# Patient Record
Sex: Male | Born: 1980 | Race: Black or African American | Hispanic: No | Marital: Married | Smoking: Never smoker
Health system: Southern US, Community
[De-identification: ages and names within clinical notes are randomized; demographics above are authoritative.]

## PROBLEM LIST (undated history)

## (undated) DIAGNOSIS — H8109 Meniere's disease, unspecified ear: Secondary | ICD-10-CM

## (undated) HISTORY — PX: HAND SURGERY: SHX662

## (undated) HISTORY — PX: I & D EXTREMITY: SHX5045

## (undated) HISTORY — PX: ABDOMINAL AORTOGRAM W/LOWER EXTREMITY: CATH118223

## (undated) HISTORY — PX: EXTERNAL FIXATION LEG: SHX1549

## (undated) HISTORY — PX: INCISION AND DRAINAGE OF WOUND: SHX1803

## (undated) HISTORY — PX: PERCUTANEOUS PINNING: SHX2209

## (undated) HISTORY — PX: APPLICATION OF WOUND VAC: SHX5189

## (undated) HISTORY — PX: CLOSED REDUCTION WRIST FRACTURE: SHX1091

## (undated) HISTORY — PX: CLOSED REDUCTION TIBIA: SHX5115

## (undated) SURGICAL SUPPLY — 6 items
CUFF TOURN SGL QUICK 24 (TOURNIQUET CUFF)
GLOVE BIOGEL PI INDICATOR 7.5 (GLOVE) ×3
GLOVE BIOGEL PI INDICATOR 8 (GLOVE) ×9
GLOVE SURG SYN 7.5  E (GLOVE) ×3
GOWN STRL REUS W/TWL LRG LVL3 (GOWN DISPOSABLE) ×12
GOWN STRL REUS W/TWL XL LVL3 (GOWN DISPOSABLE) ×6

## (undated) SURGICAL SUPPLY — 5 items
CONT SPEC 4OZ STRL OR WHT (MISCELLANEOUS)
GLOVE SURG SYN 7.5  E (GLOVE) ×1
GOWN STRL REUS W/TWL LRG LVL3 (GOWN DISPOSABLE) ×2
GOWN STRL REUS W/TWL XL LVL3 (GOWN DISPOSABLE) ×2
HANDPIECE INTERPULSE COAX TIP (DISPOSABLE)

## (undated) SURGICAL SUPPLY — 8 items
BNDG GAUZE DERMACEA FLUFF (GAUZE/BANDAGES/DRESSINGS) ×2
GLOVE BIOGEL PI INDICATOR 7.5 (GLOVE) ×2
GLOVE BIOGEL PI INDICATOR 8 (GLOVE) ×4
GLOVE SURG SYN 7.5  E (GLOVE) ×2
GOWN STRL REUS W/TWL LRG LVL3 (GOWN DISPOSABLE) ×4
GOWN STRL REUS W/TWL XL LVL3 (GOWN DISPOSABLE) ×4
PIN APEX 5X180 (PIN) ×4
SUT VIC AB 0 CT1 27 (SUTURE) ×8

## (undated) SURGICAL SUPPLY — 7 items
CONT SPEC 4OZ STRL OR WHT (MISCELLANEOUS)
CUFF TOURN SGL QUICK 34 (TOURNIQUET CUFF)
DRAPE ORTHO SPLIT 77X108 STRL (DRAPES) ×4
GOWN STRL REUS W/TWL LRG LVL3 (GOWN DISPOSABLE) ×4
GOWN STRL REUS W/TWL XL LVL3 (GOWN DISPOSABLE) ×2
HANDPIECE INTERPULSE COAX TIP (DISPOSABLE) ×2
PADDING CAST COTTON 4X4 STRL (CAST SUPPLIES)

## (undated) SURGICAL SUPPLY — 7 items
CONT SPEC 4OZ STRL OR WHT (MISCELLANEOUS)
CUFF TOURN SGL QUICK 34 (TOURNIQUET CUFF)
GLOVE SURG SYN 7.5  E (GLOVE) ×1
GOWN STRL REUS W/TWL LRG LVL3 (GOWN DISPOSABLE) ×2
GOWN STRL REUS W/TWL XL LVL3 (GOWN DISPOSABLE) ×2
HANDPIECE INTERPULSE COAX TIP (DISPOSABLE)
PADDING CAST COTTON 4X4 STRL (CAST SUPPLIES) ×1

## (undated) SURGICAL SUPPLY — 3 items
DRAPE ORTHO SPLIT 77X108 STRL (DRAPES) ×1
GOWN STRL REUS W/TWL LRG LVL3 (GOWN DISPOSABLE) ×1
HANDPIECE INTERPULSE COAX TIP (DISPOSABLE)

---
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
## 2021-10-06 DIAGNOSIS — T8149XA Infection following a procedure, other surgical site, initial encounter: Secondary | ICD-10-CM

## 2021-10-06 DIAGNOSIS — S83105A Unspecified dislocation of left knee, initial encounter: Secondary | ICD-10-CM

## 2021-10-06 HISTORY — DX: Meniere's disease, unspecified ear: H81.09

## 2021-10-06 HISTORY — PX: EXTERNAL FIXATION LEG: SHX1549

## 2021-10-06 HISTORY — PX: CLOSED REDUCTION WRIST FRACTURE: SHX1091
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
## 2021-10-06 NOTE — Anesthesia Postprocedure Evaluation (Signed)
Anesthesia Post Note  Patient: Bradley [name]  Procedure(s) Performed: OPEN REDUCTION EXTERNAL FIXATION LEFT KNEE (Left: Knee) CLOSED REDUCTION LEFT WRIST (Wrist) CLOSED REDUCTION WRIST (Wrist)     Patient location during evaluation: PACU Anesthesia Type: General Level of consciousness: awake and alert Pain management: pain level controlled Vital Signs Assessment: post-procedure vital signs reviewed and stable Respiratory status: spontaneous breathing, nonlabored ventilation, respiratory function stable and patient connected to nasal cannula oxygen Cardiovascular status: blood pressure returned to baseline and stable Postop Assessment: no apparent nausea or vomiting Anesthetic complications: no   No notable events documented.  Last Vitals:  Vitals:   10/06/21 1920 10/06/21 1935  BP: (!) 166/74 (!) 148/77  Pulse: 91 98  Resp: (!) 23 (!) 22  Temp:  36.6 C  SpO2: 100% 99%    Last Pain:  Vitals:   10/06/21 1935  TempSrc:   PainSc: Asleep                 Beryle Lathe
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
## 2021-10-06 NOTE — Transfer of Care (Signed)
Immediate Anesthesia Transfer of Care Note  Patient: Bradley [name]  Procedure(s) Performed: OPEN REDUCTION EXTERNAL FIXATION LEFT KNEE (Left: Knee) CLOSED REDUCTION LEFT WRIST (Wrist) CLOSED REDUCTION WRIST (Wrist)  Patient Location: PACU  Anesthesia Type:General  Level of Consciousness: drowsy and patient cooperative  Airway & Oxygen Therapy: Patient Spontanous Breathing and Patient connected to nasal cannula oxygen  Post-op Assessment: Report given to RN and Post -op Vital signs reviewed and stable  Post vital signs: Reviewed and stable  Last Vitals:  Vitals Value Taken Time  BP 117/99 10/06/21 1849  Temp    Pulse 94 10/06/21 1856  Resp 42 10/06/21 1858  SpO2 87 % 10/06/21 1856  Vitals shown include unvalidated device data.  Last Pain:  Vitals:   10/06/21 1703  TempSrc:   PainSc: 10-Worst pain ever         Complications: No notable events documented.

## 2021-10-06 NOTE — Discharge Instructions (Addendum)
REGARDING YOUR LEFT UPPER EXTREMITY:  KEEP THE SPLINT ON, CLEAN AND DRY WORK ON YOUR FINGER MOTION, MAKING FULL FIST FOLLOWED BY FULLY STRAIGHTENING THE FINGERS NO ACTIVITIES WITH THE LEFT HAND MORE THAN PAPER/PENCIL TYPE ACTIVITIES ELEVATE AS NEEDED TO REDUCE SWELLING    POST-OPERATIVE OPIOID TAPER INSTRUCTIONS: It is important to wean off of your opioid medication as soon as possible. If you do not need pain medication after your surgery it is ok to stop day one. Opioids include: Codeine, Hydrocodone(Norco, Vicodin), Oxycodone(Percocet, oxycontin) and hydromorphone amongst others.  Long term and even short term use of opiods can cause: Increased pain response Dependence Constipation Depression Respiratory depression And more.  Withdrawal symptoms can include Flu like symptoms Nausea, vomiting And more Techniques to manage these symptoms Hydrate well Eat regular healthy meals Stay active Use relaxation techniques(deep breathing, meditating, yoga) Do Not substitute Alcohol to help with tapering If you have been on opioids for less than two weeks and do not have pain than it is ok to stop all together.  Plan to wean off of opioids This plan should start within one week post op of your joint replacement. Maintain the same interval or time between taking each dose and first decrease the dose.  Cut the total daily intake of opioids by one tablet each day Next start to increase the time between doses. The last dose that should be eliminated is the evening dose.     Wound Care   Guide to Wound Care  Proper wound care may reduce the risk of infection, improve healing rates, and limit scarring.  This is a general guide to help care for and manage wounds treated with product wound matrix.   Dressing Changes The frequency of dressing changes can vary based on which product was applied, the size of the wound, or the amount of wound drainage. Dressing inspections are recommended, at  least weekly.   If you have a Wound VAC it will be changed in one week after the first time it is applied.  Then it will be changed once or twice a week.   If you don't have a Wound VAC, then place KY gel on the wound daily and cover with gauze.  Dressing Types Primary Dressing:  Non-adherent dressing goes directly over wounds being treated with the powder or sheet.  Secondary Dressing:  Secures the primary dressing in place and provides extra protection, compression, and absorption.  1. Wash Hands - To help decrease the risk of infection, caregivers should wash their hands for a minimum of 20 seconds and may use medical gloves.   2. Remove the Dressings - Avoid removing product from the wound by carefully removing the applicable dressing(s) at the time points recommended above, or as recommended by the treating physician.  Expected Color and Odor:  It is entirely normal for the wound to have an unpleasant odor and to form a caramel-colored gel as the product absorbs into the wound. It is important to leave this gel on the wound site.  3. Clean the Wound - Use clean water or saline to gently rinse around the wound surface and remove any excess discharge that may be present on the wound. Do not wipe off any of the caramel-colored gel on the wound.   What to look out for:  Large or increased amount of drainage   Surrounding skin has worsening redness or hot to touch   Increased pain in or around the wound   Flu-like symptoms, fatigue,  decreased appetite, fever   Hard, crusty wound surface with black or brown coloring  4. Apply New Dressings - Dressings should cover the entire wound and be suitable for maintaining a moist wound environment.  The non-adherent mesh dressing should be left in place.  New dressing should consist of KY Jelly to keep the wound moist and soft gauze secured with a wrap or tape.   Maintain a Hydrated Wound Area It is important to keep the wound area moist throughout the  healing process. If the wound appears to be dry during dressing changes, select a dressing that will hydrate the wound and maintain that ideal moist environment. If you are unsure what to do, ask the treating physician.  Remodeling Process Every patient heals differently, and no two cases are the same. The size and location of the wound, product type and layering configurations, and general patient health all contribute to how quickly a wound will heal.  While many factors can influence the rate at which the product absorbs, the following can be used as a general guide.   THINGS TO DO: Refrain from smoking High protein diet with plenty of vegetables and some fruit  Limit simple processed carbohydrates and sugar Protect the wound from trauma Protect the dressing  powder       Sheet            Sorbact dressing

## 2021-10-06 NOTE — Progress Notes (Signed)
Pt's Vital signs are stable at this time. RT place [place] on Pt. RT available if needed.
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
## 2021-10-06 NOTE — ED Notes (Signed)
Tourniquets both removed by Dr. Criss Alvine.  Bleeding controlled.
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
## 2021-10-06 NOTE — ED Notes (Signed)
Dr. Bedelia Person at bedside to speak with Dr. Criss Alvine.

## 2021-10-06 NOTE — Anesthesia Procedure Notes (Signed)
Procedure Name: Intubation Date/Time: 10/06/2021 4:49 PM  Performed by: Moshe Salisbury, CRNAPre-anesthesia Checklist: Patient identified, Emergency Drugs available, Suction available and Patient being monitored Patient Re-evaluated:Patient Re-evaluated prior to induction Oxygen Delivery Method: Circle System Utilized Preoxygenation: Pre-oxygenation with 100% oxygen Induction Type: IV induction and Rapid sequence Laryngoscope Size: Mac and 4 Grade View: Grade I Tube type: Oral Tube size: 8.0 mm Number of attempts: 1 Airway Equipment and Method: Stylet Placement Confirmation: ETT inserted through vocal cords under direct vision, positive ETCO2 and breath sounds checked- equal and bilateral Secured at: 24 cm Tube secured with: Tape Dental Injury: Teeth and Oropharynx as per pre-operative assessment

## 2021-10-06 NOTE — ED Notes (Signed)
Earney Hamburg notified

## 2021-10-06 NOTE — ED Notes (Signed)
Earney Hamburg PA arrived
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
## 2021-10-06 NOTE — Progress Notes (Signed)
Orthopedic Tech Progress Note Patient Details:  Bradley [name] [birth year] [id number]  Patient ID: Bradley [name], male   DOB: 17-Oct-1980, 41 y.o.   MRN: [id number] Level I; not needed. Darleen Crocker 10/06/2021, 4:57 PM

## 2021-10-06 NOTE — Op Note (Signed)
10/06/2021  5:45 PM  PATIENT:  Bradley [name]  41 y.o. male  PRE-OPERATIVE DIAGNOSIS: Left wrist lunate dislocation with palmar wound  POST-OPERATIVE DIAGNOSIS:  Same  PROCEDURE:   1.  Irrigation of left palmar wound with simple closure, 3 cm    2.  Closed reduction of left wrist lunate dislocation with application of sugar-tong splint  SURGEON: Cliffton Asters. Janee Morn, MD  PHYSICIAN ASSISTANT: None  ANESTHESIA: General  SPECIMENS:  None  DRAINS:   None  EBL: None  PREOPERATIVE INDICATIONS:  [name] is a  41 y.o. male with a closed left wrist lunate dislocation with overlying facial palmar wound.  At the time that I was consulted, the patient was imminently entering the operating room for other injuries, so I first evaluated the patient in the operating room.  Another orthopedic surgeon, Dr. Margarita Rana, was taking the patient to the operating room to address his left knee injury, and had spoken with him regarding the need to at least temporize the left wrist and the plan that I would join him in the operating room to do so.  The patient consented.  OPERATIVE IMPLANTS: None  OPERATIVE PROCEDURE: The patient was already under general anesthesia and treatment for his left knee was underway.  I evaluated the palmar wound, probed for depth to confirm its superficial nature, & prescrubbed it with a Hibiclens scrub brush.  A single staple was then placed to reapproximate it loosely.  Next, I performed a closed reduction, with mini C arm guidance.  Once gross reduction and alignment was obtained, I applied a sugar-tong splint and obtained final images.  DISPOSITION: Patient remained in the operating room under the care of Dr. Eulah Pont for his left knee.  I will need to have additional discussions with the patient regarding options for definitive management of his left wrist.  In the interim, he should be nonweightbearing on the left wrist, but may bear weight through a platform  device
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
## 2021-10-07 NOTE — H&P (View-Only) (Signed)
Subjective: Patient reports pain as marked. Worse with movement. Tolerating diet.  Urinating.  No SOB. Mild sternal CP at times from dislocation. Says he felt his knee pop again last night when he was being moved between beds after surgery.   Objective:   VITALS:   Vitals:   10/06/21 1905 10/06/21 1920 10/06/21 1935 10/06/21 2000  BP: 128/85 (!) 166/74 (!) 148/77 122/80  Pulse: 94 91 98 96  Resp: (!) 34 (!) 23 (!) 22 20  Temp:   97.9 F (36.6 C) 98.6 F (37 C)  TempSrc:    Oral  SpO2: 100% 100% 99% 100%  Weight:      Height:          Latest Ref Rng & Units 10/07/2021    5:56 AM 10/06/2021    3:18 PM 10/06/2021    3:17 PM  CBC  WBC 4.0 - 10.5 K/uL 14.3  15.5    Hemoglobin 13.0 - 17.0 g/dL 69.4  85.4  62.7   Hematocrit 39.0 - 52.0 % 30.7  41.6  43.0   Platelets 150 - 400 K/uL 222  385        Latest Ref Rng & Units 10/07/2021    5:56 AM 10/06/2021    3:18 PM 10/06/2021    3:17 PM  BMP  Glucose 70 - 99 mg/dL 035  009  381   BUN 6 - 20 mg/dL 13  12  11    Creatinine 0.61 - 1.24 mg/dL  8.29  9.37   Sodium 135 - 145 mmol/L 133  136  137   Potassium 3.5 - 5.1 mmol/L 4.1  3.4  3.3   Chloride 98 - 111 mmol/L 101  105  103   CO2 22 - 32 mmol/L 22  21    Calcium 8.9 - 10.3 mg/dL 8.5  8.8     Intake/Output      08/25 0701 08/26 0700 08/26 0701 08/27 0700   I.V. (mL/kg) 1300 (6.4)    IV Piggyback 600    Total Intake(mL/kg) 1900 (9.3)    Urine (mL/kg/hr) 1325    Blood 250    Total Output 1575    Net +325            Physical Exam: General: NAD.  Laying in bed, just finished with PT/OT Resp: No increased wob Cardio: regular rate and rhythm ABD soft Neurologically intact MSK Neurovascularly intact Sensation intact distally Intact pulses distally Dorsiflexion/Plantar flexion intact Incision: dressing C/D/I, some blood to posterior aspect of knee Ex fix in place Splint to LUE intact, can move fingers   Assessment: 1 Day Post-Op  S/P Procedure(s)  (LRB): OPEN REDUCTION EXTERNAL FIXATION LEFT KNEE (Left) CLOSED REDUCTION WRIST CLOSED REDUCTION LEFT WRIST by Dr. 9/27. Murphy on 10/06/21  Principal Problem:   Left knee dislocation   Repeat left knee CT 10/07/21 shows recurrent posterior dislocation of the left knee. Comminuted fractures of the medial femoral condyle and medial tibial plateau. Small bony fragment along the inferolateral aspect of the patella, likely a small avulsion fracture. Soft tissue swelling and ill-defined hemorrhage about the knee and proximal lower leg, most pronounced anteriorly. Soft tissue air representing a combination of postsurgical and posttraumatic air      Plan: Unfortunately he has once again dislocated the left knee despite the placement of the ex fix. Appears our other repairs are intact still. Will need to return to the OR tomorrow 8/27 at 7:30am to reduce the knee again  and place more pins to connect to the ex fix. NPO at midnight  Explained in detail that he will need a multiligamentous knee reconstruction in the future if he is able to keep his leg and not require an amputation. Still in critical period of uncertainty if leg will be viable and functional yet.     Advance diet Up with therapy as able while maintaining WB status Incentive Spirometry Elevate and Apply ice  Weightbearing: NWB LUE and LLE, able to use platform walker though Insicional and dressing care: Dressings left intact until follow-up and Reinforce dressings as needed. Since returning to OR in the morning I'd wait to change his dressings until then d/t pain that would be caused if done bedside Orthopedic device(s):  Ex fix likely for 6 weeks Showering: Keep dressing dry VTE prophylaxis: Lovenox 40mg  qd  while inpatient, can likely switch to ASA 81mg  bid x 30 days upon d/c , SCDs, ambulation Pain control: Tylenol, Tramadol, Oxycodone, Morphine PRN Follow - up plan:  TBD Contact information:  MD, PA-C  Dispo:  TBD based on pain management and PT/OT evaluations     Margarita Rana Office [phone] 10/07/2021, 7:34 AM

## 2021-10-07 NOTE — Plan of Care (Signed)

## 2021-10-07 NOTE — Progress Notes (Signed)
Patient returning to OR in AM with Dr. Eulah Pont for L knee exfix modification. We will go ahead and address L wrist injury too.   Discussed operative plan with patient, questions invited and answered,G/R/O reviewed and consent obtained.  Neil Crouch, MD Hand Surgery

## 2021-10-07 NOTE — Progress Notes (Signed)
Inpatient Rehab Admissions Coordinator Note:   Per therapy patient was screened for CIR candidacy by Mattthew Ziomek Luvenia Starch, CCC-SLP. At this time, pt is not medically ready for CIR. Pt returning to OR on 8/27 for left knee exfix modification and left wrist injury. AC will not pursue a rehab consult at this time. CIR admissions team will follow to monitor for medical readiness. A consult order will be placed if pt appears to be an appropriate candidate.   Wolfgang Phoenix, MS, CCC-SLP Admissions Coordinator [phone] 10/07/21 5:44 PM
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
## 2021-10-07 NOTE — Plan of Care (Signed)
  Problem: Education: Goal: Knowledge of General Education information will improve Description: Including pain rating scale, medication(s)/side effects and non-pharmacologic comfort measures Outcome: Progressing   Problem: Health Behavior/Discharge Planning: Goal: Ability to manage health-related needs will improve Outcome: Progressing   Problem: Activity: Goal: Risk for activity intolerance will decrease Outcome: Progressing   Problem: Nutrition: Goal: Adequate nutrition will be maintained Outcome: Progressing   Problem: Coping: Goal: Level of anxiety will decrease Outcome: Progressing   Problem: Elimination: Goal: Will not experience complications related to bowel motility Outcome: Progressing   Problem: Pain Managment: Goal: General experience of comfort will improve Outcome: Progressing   Problem: Skin Integrity: Goal: Risk for impaired skin integrity will decrease Outcome: Progressing   

## 2021-10-07 NOTE — Progress Notes (Addendum)
Subjective: Patient reports pain as marked. Worse with movement. Tolerating diet.  Urinating.  No SOB. Mild sternal CP at times from dislocation. Says he felt his knee pop again last night when he was being moved between beds after surgery.   Objective:   VITALS:   Vitals:   10/06/21 1905 10/06/21 1920 10/06/21 1935 10/06/21 2000  BP: 128/85 (!) 166/74 (!) 148/77 122/80  Pulse: 94 91 98 96  Resp: (!) 34 (!) 23 (!) 22 20  Temp:   97.9 F (36.6 C) 98.6 F (37 C)  TempSrc:    Oral  SpO2: 100% 100% 99% 100%  Weight:      Height:          Latest Ref Rng & Units 10/07/2021    5:56 AM 10/06/2021    3:18 PM 10/06/2021    3:17 PM  CBC  WBC 4.0 - 10.5 K/uL 14.3  15.5    Hemoglobin 13.0 - 17.0 g/dL 69.4  85.4  62.7   Hematocrit 39.0 - 52.0 % 30.7  41.6  43.0   Platelets 150 - 400 K/uL 222  385        Latest Ref Rng & Units 10/07/2021    5:56 AM 10/06/2021    3:18 PM 10/06/2021    3:17 PM  BMP  Glucose 70 - 99 mg/dL 035  009  381   BUN 6 - 20 mg/dL 13  12  11    Creatinine 0.61 - 1.24 mg/dL  8.29  9.37   Sodium 135 - 145 mmol/L 133  136  137   Potassium 3.5 - 5.1 mmol/L 4.1  3.4  3.3   Chloride 98 - 111 mmol/L 101  105  103   CO2 22 - 32 mmol/L 22  21    Calcium 8.9 - 10.3 mg/dL 8.5  8.8     Intake/Output      08/25 0701 08/26 0700 08/26 0701 08/27 0700   I.V. (mL/kg) 1300 (6.4)    IV Piggyback 600    Total Intake(mL/kg) 1900 (9.3)    Urine (mL/kg/hr) 1325    Blood 250    Total Output 1575    Net +325            Physical Exam: General: NAD.  Laying in bed, just finished with PT/OT Resp: No increased wob Cardio: regular rate and rhythm ABD soft Neurologically intact MSK Neurovascularly intact Sensation intact distally Intact pulses distally Dorsiflexion/Plantar flexion intact Incision: dressing C/D/I, some blood to posterior aspect of knee Ex fix in place Splint to LUE intact, can move fingers   Assessment: 1 Day Post-Op  S/P Procedure(s)  (LRB): OPEN REDUCTION EXTERNAL FIXATION LEFT KNEE (Left) CLOSED REDUCTION WRIST CLOSED REDUCTION LEFT WRIST by Dr. 9/27. Murphy on 10/06/21  Principal Problem:   Left knee dislocation   Repeat left knee CT 10/07/21 shows recurrent posterior dislocation of the left knee. Comminuted fractures of the medial femoral condyle and medial tibial plateau. Small bony fragment along the inferolateral aspect of the patella, likely a small avulsion fracture. Soft tissue swelling and ill-defined hemorrhage about the knee and proximal lower leg, most pronounced anteriorly. Soft tissue air representing a combination of postsurgical and posttraumatic air      Plan: Unfortunately he has once again dislocated the left knee despite the placement of the ex fix. Appears our other repairs are intact still. Will need to return to the OR tomorrow 8/27 at 7:30am to reduce the knee again  and place more pins to connect to the ex fix. NPO at midnight  Explained in detail that he will need a multiligamentous knee reconstruction in the future if he is able to keep his leg and not require an amputation. Still in critical period of uncertainty if leg will be viable and functional yet.     Advance diet Up with therapy as able while maintaining WB status Incentive Spirometry Elevate and Apply ice  Weightbearing: NWB LUE and LLE, able to use platform walker though Insicional and dressing care: Dressings left intact until follow-up and Reinforce dressings as needed. Since returning to OR in the morning I'd wait to change his dressings until then d/t pain that would be caused if done bedside Orthopedic device(s):  Ex fix likely for 6 weeks Showering: Keep dressing dry VTE prophylaxis: Lovenox 40mg qd  while inpatient, can likely switch to ASA 81mg bid x 30 days upon d/c , SCDs, ambulation Pain control: Tylenol, Tramadol, Oxycodone, Morphine PRN Follow - up plan:  TBD Contact information:  Timothy Murphy MD, Raeven Pint PA-C  Dispo:  TBD based on pain management and PT/OT evaluations     Hercules Hasler M Brodi Nery, PA-C Office [phone] 10/07/2021, 7:34 AM  

## 2021-10-07 NOTE — Progress Notes (Signed)
Transition of Care [facility]) - CAGE-AID Screening   Patient Details  Name: Bradley [name] MRN: [id number] Date of Birth: [birth year]    Hewitt Shorts, RN Trauma Response Nurse Phone Number: [phone] 10/07/2021, 10:05 AM       CAGE-AID Screening:    Have You Ever Felt You Ought to Cut Down on Your Drinking or Drug Use?: No Have People Annoyed You By Critizing Your Drinking Or Drug Use?: No Have You Felt Bad Or Guilty About Your Drinking Or Drug Use?: No Have You Ever Had a Drink or Used Drugs First Thing In The Morning to Steady Your Nerves or to Get Rid of a Hangover?: No CAGE-AID Score: 0  Substance Abuse Education Offered: No (does not drink or use drugs)
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
## 2021-10-08 HISTORY — PX: PERCUTANEOUS PINNING: SHX2209

## 2021-10-08 HISTORY — PX: EXTERNAL FIXATION LEG: SHX1549

## 2021-10-08 HISTORY — PX: CLOSED REDUCTION TIBIA: SHX5115

## 2021-10-08 NOTE — Plan of Care (Signed)
  Problem: Education: Goal: Knowledge of General Education information will improve Description: Including pain rating scale, medication(s)/side effects and non-pharmacologic comfort measures 10/08/2021 1104 by Christian Mate, RN Outcome: Progressing 10/08/2021 1104 by Christian Mate, RN Outcome: Progressing   Problem: Health Behavior/Discharge Planning: Goal: Ability to manage health-related needs will improve 10/08/2021 1104 by Christian Mate, RN Outcome: Progressing 10/08/2021 1104 by Christian Mate, RN Outcome: Progressing   Problem: Clinical Measurements: Goal: Ability to maintain clinical measurements within normal limits will improve 10/08/2021 1104 by Christian Mate, RN Outcome: Progressing 10/08/2021 1104 by Christian Mate, RN Outcome: Progressing Goal: Will remain free from infection 10/08/2021 1104 by Christian Mate, RN Outcome: Progressing 10/08/2021 1104 by Christian Mate, RN Outcome: Progressing Goal: Diagnostic test results will improve 10/08/2021 1104 by Christian Mate, RN Outcome: Progressing 10/08/2021 1104 by Christian Mate, RN Outcome: Progressing Goal: Respiratory complications will improve 10/08/2021 1104 by Christian Mate, RN Outcome: Progressing 10/08/2021 1104 by Christian Mate, RN Outcome: Progressing Goal: Cardiovascular complication will be avoided 10/08/2021 1104 by Christian Mate, RN Outcome: Progressing 10/08/2021 1104 by Christian Mate, RN Outcome: Progressing   Problem: Activity: Goal: Risk for activity intolerance will decrease 10/08/2021 1104 by Christian Mate, RN Outcome: Progressing 10/08/2021 1104 by Christian Mate, RN Outcome: Progressing   Problem: Nutrition: Goal: Adequate nutrition will be maintained 10/08/2021 1104 by Christian Mate, RN Outcome: Progressing 10/08/2021 1104 by Christian Mate, RN Outcome: Progressing   Problem: Coping: Goal: Level of anxiety will decrease 10/08/2021 1104 by Christian Mate, RN Outcome: Progressing 10/08/2021 1104 by Christian Mate, RN Outcome: Progressing   Problem: Elimination: Goal: Will not experience complications related to bowel motility 10/08/2021 1104 by Christian Mate, RN Outcome: Progressing 10/08/2021 1104 by Christian Mate, RN Outcome: Progressing Goal: Will not experience complications related to urinary retention 10/08/2021 1104 by Christian Mate, RN Outcome: Progressing 10/08/2021 1104 by Christian Mate, RN Outcome: Progressing   Problem: Pain Managment: Goal: General experience of comfort will improve 10/08/2021 1104 by Christian Mate, RN Outcome: Progressing 10/08/2021 1104 by Christian Mate, RN Outcome: Progressing   Problem: Safety: Goal: Ability to remain free from injury will improve 10/08/2021 1104 by Christian Mate, RN Outcome: Progressing 10/08/2021 1104 by Christian Mate, RN Outcome: Progressing   Problem: Skin Integrity: Goal: Risk for impaired skin integrity will decrease 10/08/2021 1104 by Christian Mate, RN Outcome: Progressing 10/08/2021 1104 by Christian Mate, RN Outcome: Progressing

## 2021-10-08 NOTE — Anesthesia Procedure Notes (Signed)
Procedure Name: Intubation Date/Time: 10/08/2021 7:44 AM  Performed by: Mayer Camel, CRNAPre-anesthesia Checklist: Patient identified, Emergency Drugs available, Suction available and Patient being monitored Patient Re-evaluated:Patient Re-evaluated prior to induction Oxygen Delivery Method: Circle System Utilized Preoxygenation: Pre-oxygenation with 100% oxygen Induction Type: IV induction Ventilation: Mask ventilation without difficulty, Oral airway inserted - appropriate to patient size and Two handed mask ventilation required Laryngoscope Size: Glidescope and 4 Grade View: Grade I Tube type: Oral Tube size: 8.0 mm Number of attempts: 1 Airway Equipment and Method: Stylet, Oral airway and Video-laryngoscopy Placement Confirmation: ETT inserted through vocal cords under direct vision, positive ETCO2 and breath sounds checked- equal and bilateral Secured at: 22 cm Tube secured with: Tape Dental Injury: Teeth and Oropharynx as per pre-operative assessment

## 2021-10-08 NOTE — Anesthesia Postprocedure Evaluation (Signed)
Anesthesia Post Note  Patient: [name]  Procedure(s) Performed: ADJUST EXTERNAL FIXATION LEG (Left: Leg Lower) CLOSED REDUCTION TIBIA (Left: Knee) PERCUTANEOUS PINNING VS. OPEN  WRIST (Left: Wrist)     Patient location during evaluation: PACU Anesthesia Type: General Level of consciousness: awake and alert Pain management: pain level controlled Vital Signs Assessment: post-procedure vital signs reviewed and stable Respiratory status: spontaneous breathing, nonlabored ventilation, respiratory function stable and patient connected to nasal cannula oxygen Cardiovascular status: blood pressure returned to baseline and stable Postop Assessment: no apparent nausea or vomiting Anesthetic complications: no   No notable events documented.  Last Vitals:  Vitals:   10/08/21 0930 10/08/21 0940  BP: 137/67   Pulse: 100 100  Resp: (!) 22 (!) 28  Temp:    SpO2: 95% 95%    Last Pain:  Vitals:   10/08/21 1000  TempSrc:   PainSc: 0-No pain                 Bradley [name]

## 2021-10-08 NOTE — Op Note (Signed)
10/08/2021  9:13 AM  PATIENT:  Bradley [name]  41 y.o. male  PRE-OPERATIVE DIAGNOSIS: Left wrist lunate dislocation, having undergone acute closed reduction  POST-OPERATIVE DIAGNOSIS:  Same  PROCEDURE:  1.  Open treatment of left wrist lunate dislocation with pin stabilization    2.  Excisional debridement of laceration margins in the left palm, skin only, with scissors and forceps, a linear centimeters    3.  Simple closure left palmar laceration, 4 cm  SURGEON: Cliffton Asters. Janee Morn, MD  PHYSICIAN ASSISTANT: None  ANESTHESIA: General  SPECIMENS:  None  DRAINS:   None  EBL: Less than 10 mL  PREOPERATIVE INDICATIONS:  Bradley [name] is a  41 y.o. male with a left wrist lunate dislocation that acutely underwent closed reduction and presents for more definitive management  The risks benefits and alternatives were discussed with the patient preoperatively including but not limited to the risks of infection, bleeding, nerve injury, cardiopulmonary complications, the need for revision surgery, among others, and the patient verbalized understanding and consented to proceed.  OPERATIVE IMPLANTS: 0.045 inch K wires x4  OPERATIVE PROCEDURE:  After receiving prophylactic antibiotics, the patient was escorted to the operative theatre and placed in a supine position.  General anesthesia was administered A surgical "time-out" was performed during which the planned procedure, proposed operative site, and the correct patient identity were compared to the operative consent and agreement confirmed by the circulating nurse according to current facility policy.  Following application of a tourniquet to the operative extremity, the exposed skin was prepped with Chloraprep and draped in the usual sterile fashion.  Dr. Eulah Pont was preparing the left lower extremity and subsequently operating simultaneously.  The left upper extremity tourniquet was never inflated.  Using fluoroscopic guidance, the wrist was  manipulated to place the lunate into neutral to very slight flexion and it was cross pinned temporarily to the radius.  With the lunate this stabilized, small stab incision was made in the radial aspect of the wrist, with spreading dissection down to the capsule.  A K wire was placed from the scaphoid across into the lunate.  In order to stabilize the lunate better, a small stab incision was made on the ulnar aspect of the lunate dorsally and spreading dissection was carried down with a hemostat, through the capsule and it was used to stabilize the lunate, keeping it from displacing ulnarly.  With the lunate stabilized, proximal pole the scaphoid was thus pinned to the lunate.  A second K wire was placed with a slightly different divergent path.  A third was placed from the lunate and the capitate.  Attention was shifted to the ulnar side where in a similar fashion a pin was placed from the triquetrum into the lunate.  This was done with pressure applied reducing the LT joint.  AP and lateral projections were taken and intended to be saved, but due to technical problems ultimately were lost.  The AP revealed good carpal alignment and apposition, with 4 different K wires securing such.  The lateral was similar.  The K wires were all clipped under the skin, but left long enough that they could ultimately be removed.  Attention was shifted to the palmar wound.  The staple that had previously been placed was removed.  The wound edges were little jagged and these were debrided sharply with scissors and forceps.  This amounted to both edges of a 4 cm wound, so roughly 8 linear centimeters of skin edge.  Satisfied, the wound  was copiously irrigated and then reapproximated with 4-0 Vicryl Rapide interrupted suture.  A short arm splint dressing was applied with some doughnut type padding relief over the pins, which could all be palpated deep to the dermis.  By this time, Dr. Eulah Pont had finished and exited the room.  The  patient was awakened and taken to recovery room in stable condition, breathing spontaneously.  DISPOSITION: He will return to the floor for continued inpatient care and ultimately follow-up in the office as an outpatient for his left wrist, 10 to 15 days from surgery.  My office will call to arrange such.

## 2021-10-08 NOTE — Transfer of Care (Signed)
Immediate Anesthesia Transfer of Care Note  Patient: Bradley [name]  Procedure(s) Performed: Tama Gander EXTERNAL FIXATION LEG (Left: Leg Lower) CLOSED REDUCTION TIBIA (Left: Knee) PERCUTANEOUS PINNING VS. OPEN  WRIST (Left: Wrist)  Patient Location: PACU  Anesthesia Type:General  Level of Consciousness: awake, alert  and oriented  Airway & Oxygen Therapy: Patient Spontanous Breathing and Patient connected to face mask oxygen  Post-op Assessment: Report given to RN and Post -op Vital signs reviewed and stable  Post vital signs: Reviewed and stable  Last Vitals:  Vitals Value Taken Time  BP 120/53 10/08/21 0915  Temp 37.2 C 10/08/21 0915  Pulse 100 10/08/21 0924  Resp 15 10/08/21 0924  SpO2 91 % 10/08/21 0924  Vitals shown include unvalidated device data.  Last Pain:  Vitals:   10/08/21 0716  TempSrc:   PainSc: 7       Patients Stated Pain Goal: 3 (10/08/21 0716)  Complications: No notable events documented.

## 2021-10-08 NOTE — Interval H&P Note (Signed)
History and Physical Interval Note:  10/08/2021 7:05 AM  Bradley [name]  has presented today for surgery, with the diagnosis of Leg Fx Left.  The various methods of treatment have been discussed with the patient and family. After consideration of risks, benefits and other options for treatment, the patient has consented to  Procedure(s): ADJUST EXTERNAL FIXATION LEG (Left) CLOSED REDUCTION TIBIA (Left) PERCUTANEOUS PINNING VS. OPEN  WRIST (Left) as a surgical intervention.  The patient's history has been reviewed, patient examined, no change in status, stable for surgery.  I have reviewed the patient's chart and labs.  Questions were answered to the patient's satisfaction.     Sheral Apley

## 2021-10-08 NOTE — Plan of Care (Signed)

## 2021-10-08 NOTE — Evaluation (Signed)
CHG bath completed. New gown.
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
## 2021-10-09 NOTE — Progress Notes (Signed)
Hand Surgery Progress Note  POD 1 Open tx left wrist lunate dx  Doing very well, minimal c/o pain Left wrist, moving digits fully, NVI.  OK to bear weight thru L UE with platform, but not on hand/wrist itself Needs f/u with me in the office 10-15 days following surgery--my office will contact patient to arrange  I'm available as needed for questions/concerns.  Neil Crouch, MD Hand Surgery

## 2021-10-09 NOTE — Plan of Care (Signed)
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
## 2021-10-10 NOTE — Plan of Care (Signed)
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
## 2021-10-10 NOTE — Op Note (Addendum)
This note was entered on the wrong patient and has been deleted

## 2021-10-10 NOTE — Plan of Care (Signed)
  Problem: Education: Goal: Knowledge of General Education information will improve Description Including pain rating scale, medication(s)/side effects and non-pharmacologic comfort measures Outcome: Progressing   Problem: Nutrition: Goal: Adequate nutrition will be maintained Outcome: Progressing   Problem: Pain Managment: Goal: General experience of comfort will improve Outcome: Progressing   

## 2021-10-10 NOTE — Plan of Care (Signed)
  Problem: Education: Goal: Knowledge of General Education information will improve Description: Including pain rating scale, medication(s)/side effects and non-pharmacologic comfort measures Outcome: Progressing   Problem: Health Behavior/Discharge Planning: Goal: Ability to manage health-related needs will improve Outcome: Progressing   Problem: Activity: Goal: Risk for activity intolerance will decrease Outcome: Progressing   Problem: Coping: Goal: Level of anxiety will decrease Outcome: Progressing   Problem: Elimination: Goal: Will not experience complications related to bowel motility Outcome: Progressing   

## 2021-10-10 NOTE — Op Note (Signed)
10/08/2021  11:16 AM  PATIENT:  Bradley [name]    PRE-OPERATIVE DIAGNOSIS:  Leg Fx Left  POST-OPERATIVE DIAGNOSIS:  Same  PROCEDURE:  ADJUST EXTERNAL FIXATION LEG, CLOSED REDUCTION TIBIA  SURGEON:  Sheral Apley, MD  ASSISTANT: Levester Fresh, PA-C, he was present and scrubbed throughout the case, critical for completion in a timely fashion, and for retraction, instrumentation, and closure.   ANESTHESIA:   gen  PREOPERATIVE INDICATIONS:  [name] is a  41 y.o. male with a diagnosis of Leg Fx Left who failed conservative measures and elected for surgical management.    The risks benefits and alternatives were discussed with the patient preoperatively including but not limited to the risks of infection, bleeding, nerve injury, cardiopulmonary complications, the need for revision surgery, among others, and the patient was willing to proceed.  OPERATIVE IMPLANTS: stryker ex fix  OPERATIVE FINDINGS: reduced joint post op  BLOOD LOSS: min  COMPLICATIONS: none3  TOURNIQUET TIME: none  OPERATIVE PROCEDURE:  Patient was identified in the preoperative holding area and site was marked by me He was transported to the operating theater and placed on the table in supine position taking care to pad all bony prominences. After a preincinduction time out anesthesia was induced. The left lower extremity was prepped and draped in normal sterile fashion and a pre-incision timeout was performed. He received ancef for preoperative antibiotics.   I prepped his leg and including the external fixator.  I placed pins at his proximal tibia and distal femur and used these to reduce his tibiofemoral joint to better position  I secured these in the placed into the previous bar  I took multiple x-rays I did stress his knee some and took repeat x-rays as happy with the reduction of his tibiofemoral joint on multiple views.  Sterile dressing was applied  POST OPERATIVE PLAN: Nonweightbearing  long-term treatment external fixator mobilize and chemical DVT prophylaxis
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
## 2021-10-10 NOTE — Progress Notes (Addendum)
Inpatient Rehab Admissions Coordinator:   Addendum:  I met with patient and wife at bedside to discuss CIR recommendations and goals/expectations of CIR stay.  We reviewed 3 hrs/day of therapy, physician follow up, and average length of stay 2 weeks (dependent upon progress) with goals of min A/min guard.  Wife has CNA experience and is able to provide 24/7 assistance for patient upon discharge.  Will await updated PT/OT notes to see how patient is doing with mobility prior to obtaining insurance auth for potential AIR admission. Will continue to follow.   Rehab Admissons Coordinator [place], [place], [place] [phone]

## 2021-10-11 NOTE — Progress Notes (Signed)
Subjective: Patient reports pain as moderate. Worse with movement. Sternal CP at times from dislocation. Often that hurts worse than the leg. Reports a lot of relief from the lidocaine patch. Tolerating diet. Urinating. No SOB. Continues to work with PT on mobilizing OOB.  Objective:   VITALS:   Vitals:   10/10/21 1413 10/10/21 1954 10/11/21 0400 10/11/21 0734  BP: 135/68 135/70 (!) 141/63 (!) 153/58  Pulse: 84 96 83 88  Resp: 18 16 18    Temp: 98.9 F (37.2 C) 98.5 F (36.9 C) 98 F (36.7 C) 99.1 F (37.3 C)  TempSrc: Oral  Oral Oral  SpO2: 96% 95% 96% 99%  Weight:      Height:          Latest Ref Rng & Units 10/11/2021    4:50 AM 10/10/2021    1:26 AM 10/08/2021    1:00 AM  CBC  WBC 4.0 - 10.5 K/uL 7.2  9.1  9.7   Hemoglobin 13.0 - 17.0 g/dL 7.7  7.7  9.5   Hematocrit 39.0 - 52.0 % 23.9  23.6  27.8   Platelets 150 - 400 K/uL 250  217  189       Latest Ref Rng & Units 10/11/2021    4:50 AM 10/10/2021    1:26 AM 10/08/2021    1:00 AM  BMP  Glucose 70 - 99 mg/dL 10/10/2021  97  096   BUN 6 - 20 mg/dL 15  15  12    Creatinine 0.61 - 1.24 mg/dL 283   6.62   Sodium 135 - 145 mmol/L 134  134  132   Potassium 3.5 - 5.1 mmol/L 3.8  4.0  4.0   Chloride 98 - 111 mmol/L 102  101  101   CO2 22 - 32 mmol/L 27  25  23    Calcium 8.9 - 10.3 mg/dL 8.0  8.3  8.0    Intake/Output      08/29 0701 08/30 0700 08/30 0701 08/31 0700   P.O. 360    I.V. (mL/kg) 900 (4.4)    Total Intake(mL/kg) 1260 (6.2)    Urine (mL/kg/hr) 1600 (0.3)    Total Output 1600    Net -340            Physical Exam: General: NAD.  Laying in bed, calm, comfortable Resp: No increased wob Cardio: regular rate and rhythm ABD soft Neurologically intact MSK Neurovascularly intact Sensation intact distally Intact pulses distally Dorsiflexion/Plantar flexion intact but slightly limited and painful Calf soft and compressible but slightly painful Incision: dressing C/D/I Ex fix in place Splint to LUE  intact, can move fingers   Assessment: 3 Days Post-Op  S/P Procedure(s) (LRB): ADJUST EXTERNAL FIXATION LEG (Left) CLOSED REDUCTION TIBIA (Left) PERCUTANEOUS PINNING VS. OPEN  WRIST (Left) by Dr. 9/30. Murphy on 10/06/21  Principal Problem:   Left knee dislocation   Plan: Explained in detail that we are still focusing on if he is able to keep his leg and not require an amputation. Still in critical period of uncertainty if leg will be viable and functional yet.   Most important thing right now is to prevent infection and recurrent dislocations   Advance diet Up with therapy as able while maintaining WB status Incentive Spirometry Elevate and Apply ice H/H stable at 7.7 again today. Continue to monitor. Will order blood if drops <7.0 Increased lidocaine patch uses to bid  Weightbearing: NWB LUE and LLE, able to use platform walker  though Insicional and dressing care: Dressings left intact until follow-up and Reinforce dressings as needed Orthopedic device(s):  Ex fix likely for 6 weeks Showering: Keep dressing dry VTE prophylaxis: Lovenox 40mg  qd  while inpatient, can likely switch to ASA 81mg  bid x 30 days upon d/c , SCDs, ambulation Pain control: Tylenol, Tramadol, Oxycodone, Morphine PRN Follow - up plan:  2 weeks post-op Contact information:  MD, PA-C  Dispo:  TBD. PT/OT recommending CIR and I agree given the traumatic injury and need for close monitoring since he already had a 2nd knee dislocation while inpatient. I know his wife mentioned she is a CNA, but I'm concerned about her ability to help him mobilize and maintain NWB status due to his body habitus. If he were to fall down, she would not be able to get him up from the ground. His progress with PT/OT has been limited due to pain especially chest pain from his sternal dislocation.      Bradley Rana, PA-C Office [phone] 10/11/2021, 9:49 AM
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
## 2021-10-11 NOTE — Progress Notes (Signed)
Inpatient Rehab Admissions Coordinator:   Following for my colleague Lissa Merlin.  Note still requiring assist of 3-4 for out of bed mobility.  Will need to demonstrate ability to transition out of bed with no more than max +2 assist to consider as insurance auth required.  Will discuss with TOC team.   Estill Dooms, PT, DPT Admissions Coordinator [phone] 10/11/21  2:38 PM
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
## 2021-10-11 NOTE — Plan of Care (Signed)
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
## 2021-10-12 NOTE — Plan of Care (Signed)

## 2021-10-12 NOTE — Progress Notes (Signed)
Subjective: Patient reports pain as moderate to severe. Worse with movement. Waiting on pain medicine currently. Says he can "feel the tibia pins" in his leg. Tolerating diet. Urinating. No SOB. Continues to work with PT on mobilizing OOB. Had what he thinks is a panic attack last night where he woke up confused, felt he couldn't breathe, very anxious about his leg position. Endorses anxiety about his leg being properly positioned in the bed and asks if I think his leg looks straight.   Objective:   VITALS:   Vitals:   10/11/21 2357 10/12/21 0400 10/12/21 0810 10/12/21 1605  BP: 138/76  119/62 (!) 113/56  Pulse: 82  80 87  Resp: 18 17    Temp: 97.8 F (36.6 C)  (!) 97.5 F (36.4 C) 97.9 F (36.6 C)  TempSrc: Oral  Oral Oral  SpO2: 100%  100% 97%  Weight:      Height:          Latest Ref Rng & Units 10/11/2021    4:50 AM 10/10/2021    1:26 AM 10/08/2021    1:00 AM  CBC  WBC 4.0 - 10.5 K/uL 7.2  9.1  9.7   Hemoglobin 13.0 - 17.0 g/dL 7.7  7.7  9.5   Hematocrit 39.0 - 52.0 % 23.9  23.6  27.8   Platelets 150 - 400 K/uL 250  217  189       Latest Ref Rng & Units 10/11/2021    4:50 AM 10/10/2021    1:26 AM 10/08/2021    1:00 AM  BMP  Glucose 70 - 99 mg/dL 119  97  147   BUN 6 - 20 mg/dL 15  15  12    Creatinine 0.61 - 1.24 mg/dL  8.29  5.62   Sodium 135 - 145 mmol/L 134  134  132   Potassium 3.5 - 5.1 mmol/L 3.8  4.0  4.0   Chloride 98 - 111 mmol/L 102  101  101   CO2 22 - 32 mmol/L 27  25  23    Calcium 8.9 - 10.3 mg/dL 8.0  8.3  8.0    Intake/Output      08/30 0701 08/31 0700 08/31 0701 09/01 0700   P.O. 390 900   I.V. (mL/kg)     Total Intake(mL/kg) 390 (1.9) 900 (4.4)   Urine (mL/kg/hr) 200 (0) 1700 (0.8)   Total Output 200 1700   Net +190 -800           Physical Exam: General: NAD.  Laying in bed, calm, slightly anxious Resp: No increased wob Cardio: regular rate and rhythm ABD soft Neurologically intact MSK Neurovascularly intact Sensation intact  distally Intact pulses distally Dorsiflexion/Plantar flexion intact but slightly limited and painful Calf soft and compressible but slightly painful Incision: dressing C/D/I Ex fix in place Splint to LUE intact, can move fingers   Assessment: 4 Days Post-Op  S/P Procedure(s) (LRB): ADJUST EXTERNAL FIXATION LEG (Left) CLOSED REDUCTION TIBIA (Left) PERCUTANEOUS PINNING VS. OPEN  WRIST (Left) by Dr. 9/31. Murphy on 10/06/21  Principal Problem:   Left knee dislocation   Plan: Explained in detail that we are still focusing on if he is able to keep his leg and not require an amputation. Still in critical period of uncertainty if leg will be viable and functional yet.   Most important thing right now is to prevent infection and recurrent dislocations   Advance diet Up with therapy as able while maintaining  WB status Incentive Spirometry Elevate and Apply ice H/H stable at 7.7 again today. Continue to monitor. Will order blood if drops <7.0 Increased lidocaine patch uses to bid Will add in Xanax PRN for anxiety  Weightbearing: NWB LUE and LLE, able to use platform walker though Insicional and dressing care: Dressings left intact until follow-up and Reinforce dressings as needed Orthopedic device(s):  Ex fix likely for 6 weeks Showering: Keep dressing dry VTE prophylaxis: Lovenox 40mg  qd  while inpatient, can switch to Xarelto 10mg  daily  x 30 days upon d/c , SCDs, ambulation Pain control: Tylenol, Tramadol, Oxycodone, Morphine PRN Follow - up plan:  2 weeks post-op Contact information:  MD, PA-C  Dispo:  TBD. PT/OT recommending CIR and I agree given the traumatic injury and need for close monitoring since he already had a 2nd knee dislocation while inpatient. He continues to make some slow progress with PT/OT. This has been limited due to pain especially chest pain from his sternal dislocation. CIR notes indicate they want to see better progress  before pursuing admission.     Margarita Rana, PA-C Office [phone] 10/12/2021, 6:06 PM
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
## 2021-10-12 NOTE — Progress Notes (Signed)
Inpatient Rehabilitation Admissions Coordinator   I met at bedside with patient and his brother. Continued discussions concerning possible CIR admit. I wait further progress with therapy before pursuing insurance approval for CIR. Hopeful next week to pursue.  Danne Baxter, RN, MSN Rehab Admissions Coordinator [phone] 10/12/2021 2:43 PM
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
## 2021-10-13 NOTE — Progress Notes (Signed)
Pt woke up from sleep complaining of SOB. Assessed pt with normal vs and stable at SPO2 100% RA. No sweating or fever. Paged Orthopedics.

## 2021-10-13 NOTE — Plan of Care (Signed)
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
## 2021-10-13 NOTE — Plan of Care (Signed)

## 2021-10-14 NOTE — Progress Notes (Signed)
Subjective: Patient in very good spirits yesterday. Working towards improving mobility so that he can get to CIR. Worked with PT yesterday primarily just doing exercises in bed. Denies distal n/t. Pain in the leg well controlled. No acute issues.  Objective:   VITALS:   Vitals:   10/13/21 0355 10/13/21 0739 10/13/21 1645 10/14/21 0349  BP:   (!) 140/66 138/73  Pulse:  82 83 83  Resp: 19  18 16   Temp:  97.6 F (36.4 C) 98.2 F (36.8 C) 98.5 F (36.9 C)  TempSrc:  Oral Oral   SpO2:  100% 99% 100%  Weight:      Height:          Latest Ref Rng & Units 10/13/2021    7:09 AM 10/11/2021    4:50 AM 10/10/2021    1:26 AM  CBC  WBC 4.0 - 10.5 K/uL 9.5  7.2  9.1   Hemoglobin 13.0 - 17.0 g/dL 7.8  7.7  7.7   Hematocrit 39.0 - 52.0 % 23.4  23.9  23.6   Platelets 150 - 400 K/uL 327  250  217       Latest Ref Rng & Units 10/13/2021    7:09 AM 10/11/2021    4:50 AM 10/10/2021    1:26 AM  BMP  Glucose 70 - 99 mg/dL 10/12/2021  233  97   BUN 6 - 20 mg/dL 17  15  15    Creatinine 0.61 - 1.24 mg/dL 007   6.22   Sodium 135 - 145 mmol/L 138  134  134   Potassium 3.5 - 5.1 mmol/L 4.1  3.8  4.0   Chloride 98 - 111 mmol/L 101  102  101   CO2 22 - 32 mmol/L 28  27  25    Calcium 8.9 - 10.3 mg/dL 8.4  8.0  8.3    Intake/Output      09/01 0701 09/02 0700 09/02 0701 09/03 0700   P.O. 480    Total Intake(mL/kg) 480 (2.4)    Urine (mL/kg/hr) 400 (0.1)    Total Output 400    Net +80            Physical Exam: General: NAD.  Laying in bed, calm, slightly anxious Resp: No increased wob Cardio: regular rate and rhythm ABD soft Neurologically intact MSK Neurovascularly intact Sensation intact distally Intact pulses distally Dorsiflexion/Plantar flexion intact but slightly limited and painful Calf soft and compressible but slightly painful Incision: dressing C/D/I Ex fix in place Splint to LUE intact, can move fingers   Assessment: 6 Days Post-Op  S/P Procedure(s) (LRB): ADJUST  EXTERNAL FIXATION LEG (Left) CLOSED REDUCTION TIBIA (Left) PERCUTANEOUS PINNING VS. OPEN  WRIST (Left) by Dr. 11/02. Murphy on 10/06/21  Principal Problem:   Left knee dislocation   Plan: Explained in detail that we are still focusing on if he is able to keep his leg and not require an amputation. Still in critical period of uncertainty if leg will be viable and functional yet.   Most important thing right now is to prevent infection and recurrent dislocations   Advance diet Up with therapy as able while maintaining WB status Incentive Spirometry Elevate and Apply ice H/H stable at 7.8 yesterday. Continue to monitor. Will order blood if drops <7.0 Increased lidocaine patch uses to bid Will add in Xanax PRN for anxiety  Weightbearing: NWB LUE and LLE, able to use platform walker though Insicional and dressing care: Dressings left intact  until follow-up and Reinforce dressings as needed Orthopedic device(s):  Ex fix likely for 6 weeks Showering: Keep dressing dry VTE prophylaxis: Lovenox 40mg  qd  while inpatient, can switch to Xarelto 10mg  daily  x 30 days upon d/c , SCDs, ambulation Pain control: Tylenol, Tramadol, Oxycodone, Morphine PRN Follow - up plan:  2 weeks post-op Contact information:  MD, PA-C  Dispo:  TBD. PT/OT recommending CIR and I agree given the traumatic injury and need for close monitoring since he already had a 2nd knee dislocation while inpatient. He continues to make some slow progress with PT/OT. This has been limited due to pain especially chest pain from his sternal dislocation. CIR notes indicate they want to see better progress before pursuing admission.     Margarita Rana, MD Office [phone] 10/14/2021, 7:06 AM

## 2021-10-14 NOTE — Plan of Care (Signed)

## 2021-10-15 NOTE — Plan of Care (Signed)
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
## 2021-10-16 NOTE — Progress Notes (Signed)
Inpatient Rehabilitation Admissions Coordinator   I met with patient and spouse at bedside. Once I get updated OT notes today, I will begin insurance Auth for CIR. They are closed today for the holiday, but we can proceed to have clinicals available for when they open Tuesday.  Danne Baxter, RN, MSN Rehab Admissions Coordinator [phone] 10/16/2021 11:15 AM
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
## 2021-10-17 NOTE — Progress Notes (Signed)
Inpatient Rehab Admissions Coordinator:   Insurance authorization for CIR submitted this morning. Will continue to follow for potential CIR admission.   Rehab Admissons Coordinator [place], [place], [place] [phone]

## 2021-10-17 NOTE — Progress Notes (Signed)
Subjective: Patient reports pain as moderate. Chest feeling much better than before. Tolerating diet. Only small BM so far. Urinating. No SOB. Continues to work with PT on mobilizing OOB and is making good progress. Very motivated. In much better spirits about his whole situation. Awaiting CIR approval.    Objective:   VITALS:   Vitals:   10/16/21 1451 10/16/21 2013 10/17/21 0419 10/17/21 0745  BP: 127/69 (!) 147/69 (!) 145/67 116/62  Pulse: 86 86 81 79  Resp: 18 17 17 20   Temp: 98 F (36.7 C) 98 F (36.7 C) 97.9 F (36.6 C)   TempSrc: Oral     SpO2: 100% 100% 100% 99%  Weight:      Height:          Latest Ref Rng & Units 10/16/2021    4:41 AM 10/15/2021   12:42 AM 10/13/2021    7:09 AM  CBC  WBC 4.0 - 10.5 K/uL 7.2  8.1  9.5   Hemoglobin 13.0 - 17.0 g/dL 7.4  7.7  7.8   Hematocrit 39.0 - 52.0 % 22.7  24.3  23.4   Platelets 150 - 400 K/uL 396  392  327       Latest Ref Rng & Units 10/15/2021   12:42 AM 10/13/2021    7:09 AM 10/11/2021    4:50 AM  BMP  Glucose 70 - 99 mg/dL 10/13/2021  458  099   BUN 6 - 20 mg/dL 16  17  15    Creatinine 0.61 - 1.24 mg/dL 833   8.25   Sodium 135 - 145 mmol/L 137  138  134   Potassium 3.5 - 5.1 mmol/L 4.2  4.1  3.8   Chloride 98 - 111 mmol/L 106  101  102   CO2 22 - 32 mmol/L 24  28  27    Calcium 8.9 - 10.3 mg/dL 8.4  8.4  8.0    Intake/Output      09/04 0701 09/05 0700 09/05 0701 09/06 0700   P.O. 600    Total Intake(mL/kg) 600 (2.9)    Urine (mL/kg/hr) 800 (0.2)    Stool 0    Total Output 800    Net -200         Stool Occurrence 1 x       Physical Exam: General: NAD.  Laying in bed, calm, comfortable Resp: No increased wob Cardio: regular rate and rhythm ABD soft Neurologically intact MSK Neurovascularly intact Sensation intact distally Intact pulses distally Dorsiflexion/Plantar flexion intact  Calf soft and compressible  Incision: dressing C/D/I Ex fix in place Splint to LUE intact, can move  fingers   Assessment: 9 Days Post-Op  S/P Procedure(s) (LRB): ADJUST EXTERNAL FIXATION LEG (Left) CLOSED REDUCTION TIBIA (Left) PERCUTANEOUS PINNING VS. OPEN  WRIST (Left) by Dr. 11/05. Murphy on 10/06/21  Principal Problem:   Left knee dislocation   Ordered updated left knee xray today which shows the knee is still in stable position with the ex-fix holding the joint reduced. Minor posterior subluxation as seen on prior imaging. Ok with 11/06 to leave as is.   Plan: Will change dressings and remove sutures on Friday since that will be 2 week post-op mark. Will work with RN to have him pre-medicated prior to me doing so   Advance diet Up with therapy as able while maintaining WB status Incentive Spirometry Elevate and Apply ice H/H 7.4 yesterday. Continue to monitor. Will order blood if drops <7.0 Continue lidocaine  patches for sternum pain   Weightbearing: NWB LUE and LLE, able to use platform walker though Insicional and dressing care: Dressings left intact until follow-up and Reinforce dressings as needed Orthopedic device(s):  Ex fix likely for 6 weeks Showering: Keep dressing dry VTE prophylaxis: Lovenox 40mg  qd  while inpatient, can switch to Xarelto 10mg  daily  x 30 days upon d/c , SCDs, ambulation Pain control: Tylenol, Tramadol, Oxycodone, Morphine PRN Follow - up plan:  2 weeks post-op Contact information:  MD, PA-C  Dispo:  PT/OT recommending CIR which I think is best for him . Approval sent off today.     Margarita Rana, PA-C Office [phone] 10/17/2021, 9:33 AM
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
## 2021-10-18 NOTE — Progress Notes (Addendum)
Subjective: Patient reports pain as moderate. Chest feeling much better than before. Tolerating diet. Urinating. No SOB. Continues to work with PT on mobilizing OOB and is making good progress. Very motivated. In much better spirits about his whole situation. Has CIR approval. Waiting on bed placement.    Objective:   VITALS:   Vitals:   10/17/21 1418 10/17/21 1951 10/18/21 0753 10/18/21 1356  BP: 128/62 (!) 144/66 (!) 147/76 (!) 141/79  Pulse: 85 79 76 94  Resp: 19  17 17   Temp: 97.7 F (36.5 C)  97.6 F (36.4 C) 98.1 F (36.7 C)  TempSrc: Oral     SpO2: 98%  97% 100%  Weight:      Height:          Latest Ref Rng & Units 10/18/2021   12:58 PM 10/16/2021    4:41 AM 10/15/2021   12:42 AM  CBC  WBC 4.0 - 10.5 K/uL 7.6  7.2  8.1   Hemoglobin 13.0 - 17.0 g/dL 8.4  7.4  7.7   Hematocrit 39.0 - 52.0 % 26.6  22.7  24.3   Platelets 150 - 400 K/uL 474  396  392       Latest Ref Rng & Units 10/15/2021   12:42 AM 10/13/2021    7:09 AM 10/11/2021    4:50 AM  BMP  Glucose 70 - 99 mg/dL 10/13/2021  269  485   BUN 6 - 20 mg/dL 16  17  15    Creatinine 0.61 - 1.24 mg/dL 462   7.03   Sodium 135 - 145 mmol/L 137  138  134   Potassium 3.5 - 5.1 mmol/L 4.2  4.1  3.8   Chloride 98 - 111 mmol/L 106  101  102   CO2 22 - 32 mmol/L 24  28  27    Calcium 8.9 - 10.3 mg/dL 8.4  8.4  8.0    Intake/Output      09/05 0701 09/06 0700 09/06 0701 09/07 0700   P.O.  240   Total Intake(mL/kg)  240 (1.2)   Urine (mL/kg/hr)  1350 (0.9)   Stool     Total Output  1350   Net  -1110           Physical Exam: General: NAD.  Sitting up in bedside chair with leg and arm elevated, calm, comfortable Resp: No increased wob Cardio: regular rate and rhythm ABD soft Neurologically intact MSK Neurovascularly intact Sensation intact distally Intact pulses distally Dorsiflexion/Plantar flexion intact  Calf soft and compressible  Incision: dressing C/D/I Ex fix in place Splint to LUE intact, can move  fingers   Assessment: 10 Days Post-Op  S/P Procedure(s) (LRB): ADJUST EXTERNAL FIXATION LEG (Left) CLOSED REDUCTION TIBIA (Left) PERCUTANEOUS PINNING VS. OPEN  WRIST (Left) by Dr. 11/06. Murphy on 10/06/21  Principal Problem:   Left knee dislocation    Plan: Will change dressings and remove sutures on Friday since that will be 2 week post-op mark. Will work with RN to have him pre-medicated prior to me doing so   Advance diet Up with therapy as able while maintaining WB status Incentive Spirometry Elevate and Apply ice H/H 8.4 today so improving. Will add Iron supplement to med list Continue lidocaine patches for sternum pain   Weightbearing: NWB LUE and LLE, able to use platform walker though Insicional and dressing care: Dressings left intact until follow-up and Reinforce dressings as needed Orthopedic device(s):  Ex fix likely for 6  weeks Showering: Keep dressing dry VTE prophylaxis: Lovenox 40mg  qd  while inpatient, can switch to Xarelto 10mg  daily  x 30 days upon d/c , SCDs, ambulation Pain control: Tylenol, Tramadol, Oxycodone, Morphine, lidocaine patches PRN Follow - up plan:  2 weeks post-op Contact information:  MD, PA-C  Dispo:  CIR once bed available     Margarita Rana Office Levester Fresh 10/18/2021, 2:20 PM
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
## 2021-10-18 NOTE — Progress Notes (Addendum)
Inpatient Rehabilitation Admissions Coordinator   Noted Hgb 7.4 on 9/4. Are there plans to recheck? I Have insurance approval for Cir , but await bed availability. I met with patient and wife at bedside and they are aware.  Danne Baxter, RN, MSN Rehab Admissions Coordinator [phone] 10/18/2021 9:10 AM

## 2021-10-18 NOTE — Plan of Care (Signed)
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
## 2021-10-19 NOTE — Progress Notes (Signed)
Inpatient Rehabilitation Admissions Coordinator   CIR bed is not available today for admission.  Ottie Glazier, RN, MSN Rehab Admissions Coordinator [phone] 10/19/2021 11:36 AM

## 2021-10-19 NOTE — Plan of Care (Signed)
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
## 2021-10-19 NOTE — Progress Notes (Signed)
    Subjective: Has CIR approval. Waiting on bed placement.    Objective:   VITALS:   Vitals:   10/18/21 1356 10/18/21 1945 10/19/21 0933 10/19/21 1357  BP: (!) 141/79 (!) 149/71 124/62 125/63  Pulse: 94 84 78 85  Resp: 17  17 18   Temp: 98.1 F (36.7 C) 98 F (36.7 C) 97.8 F (36.6 C) 98.1 F (36.7 C)  TempSrc:  Oral Oral Oral  SpO2: 100% 100% 100% 97%  Weight:      Height:          Latest Ref Rng & Units 10/18/2021   12:58 PM 10/16/2021    4:41 AM 10/15/2021   12:42 AM  CBC  WBC 4.0 - 10.5 K/uL 7.6  7.2  8.1   Hemoglobin 13.0 - 17.0 g/dL 8.4  7.4  7.7   Hematocrit 39.0 - 52.0 % 26.6  22.7  24.3   Platelets 150 - 400 K/uL 474  396  392       Latest Ref Rng & Units 10/15/2021   12:42 AM 10/13/2021    7:09 AM 10/11/2021    4:50 AM  BMP  Glucose 70 - 99 mg/dL 10/13/2021  295  284   BUN 6 - 20 mg/dL 16  17  15    Creatinine 0.61 - 1.24 mg/dL 132   4.40   Sodium 135 - 145 mmol/L 137  138  134   Potassium 3.5 - 5.1 mmol/L 4.2  4.1  3.8   Chloride 98 - 111 mmol/L 106  101  102   CO2 22 - 32 mmol/L 24  28  27    Calcium 8.9 - 10.3 mg/dL 8.4  8.4  8.0    Intake/Output      09/06 0701 09/07 0700 09/07 0701 09/08 0700   P.O. 480    Total Intake(mL/kg) 480 (2.4)    Urine (mL/kg/hr) 1350 (0.3)    Total Output 1350    Net -870             Assessment: 11 Days Post-Op  S/P Procedure(s) (LRB): ADJUST EXTERNAL FIXATION LEG (Left) CLOSED REDUCTION TIBIA (Left) PERCUTANEOUS PINNING VS. OPEN  WRIST (Left) by Dr. 11/07. Murphy on 10/06/21  Principal Problem:   Left knee dislocation    Plan: Will change dressings and remove sutures tomorrow likely around 1pm since that will be the 2 week post-op mark. Will work with RN to have him pre-medicated prior to me doing so       Advance diet Up with therapy as able while maintaining WB status Incentive Spirometry Elevate and Apply ice H/H 8.4 yesterday so improving. Have added Iron supplement to med list. Will recheck H/H  in AM Continue lidocaine patches for sternum pain   Weightbearing: NWB LUE and LLE, able to use platform walker though Insicional and dressing care: Dressings left intact until follow-up and Reinforce dressings as needed Orthopedic device(s):  Ex fix likely for 6 weeks Showering: Keep dressing dry VTE prophylaxis: Lovenox 40mg  qd  while inpatient, can switch to Xarelto 10mg  daily  x 30 days upon d/c , SCDs, ambulation Pain control: Tylenol, Tramadol, Oxycodone, Morphine, lidocaine patches PRN Follow - up plan:  2 weeks post-op Contact information:  11/08 MD, Jewel Baize PA-C  Dispo:  CIR once bed available     10/08/21 Office 10/19/2021, 5:11 PM

## 2021-10-20 NOTE — Progress Notes (Signed)
Orthopedic Tech Progress Note Patient Details:  Bradley [name] [birth year] [id number]  Casting Type of Cast: Short arm cast Cast Location: LUE Cast Material: Fiberglass Cast Intervention: Application  Post Interventions Patient Tolerated: Well   Shaili Donalson A Siara Gorder 10/20/2021, 11:25 AM

## 2021-10-20 NOTE — Progress Notes (Addendum)
Inpatient Rehab Admissions Coordinator:   Addendum: Patient medically cleared. Patient to admit to CIR today.    We have a bed for this patient today. Will plan to admit to CIR if medically ready.   Rehab Admissons Coordinator [place], , [place] [phone]
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
## 2021-10-20 NOTE — H&P (View-Only) (Signed)
Subjective: Patient reports pain as moderate. Chest continues to feel much better since using lidocaine patches. Tolerating diet. Still no major BM. Urinating. No SOB. Continues to work with PT on mobilizing OOB and is making good progress. Very motivated. Has a bed ready in CIR.   Objective:   VITALS:   Vitals:   10/19/21 0933 10/19/21 1357 10/19/21 2247 10/20/21 0934  BP: 124/62 125/63 (!) 122/42 129/69  Pulse: 78 85 85 78  Resp: 17 18 16 17   Temp: 97.8 F (36.6 C) 98.1 F (36.7 C) 97.6 F (36.4 C) 97.8 F (36.6 C)  TempSrc: Oral Oral Oral Oral  SpO2: 100% 97% 100% 100%  Weight:      Height:          Latest Ref Rng & Units 10/20/2021   12:22 AM 10/18/2021   12:58 PM 10/16/2021    4:41 AM  CBC  WBC 4.0 - 10.5 K/uL 9.1  7.6  7.2   Hemoglobin 13.0 - 17.0 g/dL 8.2  8.4  7.4   Hematocrit 39.0 - 52.0 % 26.0  26.6  22.7   Platelets 150 - 400 K/uL 468  474  396       Latest Ref Rng & Units 10/15/2021   12:42 AM 10/13/2021    7:09 AM 10/11/2021    4:50 AM  BMP  Glucose 70 - 99 mg/dL 10/13/2021  643  142   BUN 6 - 20 mg/dL 16  17  15    Creatinine 0.61 - 1.24 mg/dL 767   0.11   Sodium 135 - 145 mmol/L 137  138  134   Potassium 3.5 - 5.1 mmol/L 4.2  4.1  3.8   Chloride 98 - 111 mmol/L 106  101  102   CO2 22 - 32 mmol/L 24  28  27    Calcium 8.9 - 10.3 mg/dL 8.4  8.4  8.0    Intake/Output      09/07 0701 09/08 0700 09/08 0701 09/09 0700   P.O.     Total Intake(mL/kg)     Urine (mL/kg/hr)     Total Output     Net             Physical Exam: General: NAD.  Laying in bed, calm, comfortable Resp: No increased wob Cardio: regular rate and rhythm ABD soft Neurologically intact MSK Neurovascularly intact Sensation intact distally Intact pulses distally Dorsiflexion/Plantar flexion intact  Calf soft and compressible  Incision: dressing C/D/I Ex fix in place Cast to LUE intact, can move fingers   I spoke with his RN about giving him Morphine prior to removing his  dressings. Once she did that, I removed the dressings completely. He tolerated that well. Upon removing the dressings and visualizing the laceration, I could see that the majority of it looks well healed without evidence of dehiscence or discharge. I began to remove the Nylon sutures which were more painful for him. Once I began to remove them from the middle portion of the wound, the edges began to pull away from each other resulting in gapping areas where I could see down into the subcutaneous tissue. The middle 1/3 of the incision does not look good at all. The subcutaneous tissue appears to be a mixture of serosanguinous fluid and pus into what looks liquefied rather than solid like normal tissue. There is a strong odor of infection present. I pressed on the areas around this portion and no discharge actively seeped out of the  wound rather it remained contained inside it. I decided to leave 2 sutures in place at the opposite ends of this middle area to prevent it from further dehiscence. I cleaned the large wound and placed new clean dressings over it which include Adaptic, 4x4s, ABDs, Kerlix, and ACE wraps. For the 4 pin sites, I was unable to find fresh xeroform so I left the existing xeroform there and placed fresh Cling wrap around each of them. The patient tolerated the entire process well.     Images of wound once dressings removed but prior to removing sutures        Assessment: 12 Days Post-Op  S/P Procedure(s) (LRB): ADJUST EXTERNAL FIXATION LEG (Left) CLOSED REDUCTION TIBIA (Left) PERCUTANEOUS PINNING VS. OPEN  WRIST (Left) by Dr. Jewel Baize. Murphy on 10/06/21  Principal Problem:   Left knee dislocation    Plan: Unfortunately due to the wound being infected, the patient is going to need to return to the OR on Saturday 10/21/21 at 7:30am for a left knee I&D with Dr. Everardo Pacific who is the on call provider. Hopefully there is no frank pus in the wound or knee joint. Due to the nature of  the original injury and the fact that his knee joint capsule was largely destroyed making it unable to be repaired during surgery, it is possible for infection to be there.  Will make NPO at midnight.    Advance diet Up with therapy as able while maintaining WB status Incentive Spirometry Elevate and Apply ice H/H very slightly decreased at 8.2 today. Has had Iron supplement for 2 days Continue lidocaine patches for sternum pain   Weightbearing: NWB LUE and LLE, able to use platform walker though Insicional and dressing care: Reinforce PRN Orthopedic device(s):  Ex fix likely for 6 weeks Showering: Keep dressing dry VTE prophylaxis: Lovenox 100mg  daily while inpatient, can switch to Xarelto x 30 days upon d/c , SCDs, ambulation Pain control: Tylenol, Tramadol, Oxycodone, Morphine, lidocaine patches PRN   Dispo:  CIR hopefully on Sunday if surgery goes well and patient recovering appropriately. On call team will round on him.     Friday, PA-C Office [phone] 10/20/2021, 2:49 PM
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
## 2021-10-20 NOTE — Progress Notes (Addendum)
Subjective: Patient reports pain as moderate. Chest continues to feel much better since using lidocaine patches. Tolerating diet. Still no major BM. Urinating. No SOB. Continues to work with PT on mobilizing OOB and is making good progress. Very motivated. Has a bed ready in CIR.   Objective:   VITALS:   Vitals:   10/19/21 0933 10/19/21 1357 10/19/21 2247 10/20/21 0934  BP: 124/62 125/63 (!) 122/42 129/69  Pulse: 78 85 85 78  Resp: 17 18 16 17   Temp: 97.8 F (36.6 C) 98.1 F (36.7 C) 97.6 F (36.4 C) 97.8 F (36.6 C)  TempSrc: Oral Oral Oral Oral  SpO2: 100% 97% 100% 100%  Weight:      Height:          Latest Ref Rng & Units 10/20/2021   12:22 AM 10/18/2021   12:58 PM 10/16/2021    4:41 AM  CBC  WBC 4.0 - 10.5 K/uL 9.1  7.6  7.2   Hemoglobin 13.0 - 17.0 g/dL 8.2  8.4  7.4   Hematocrit 39.0 - 52.0 % 26.0  26.6  22.7   Platelets 150 - 400 K/uL 468  474  396       Latest Ref Rng & Units 10/15/2021   12:42 AM 10/13/2021    7:09 AM 10/11/2021    4:50 AM  BMP  Glucose 70 - 99 mg/dL 10/13/2021  643  142   BUN 6 - 20 mg/dL 16  17  15    Creatinine 0.61 - 1.24 mg/dL 767   0.11   Sodium 135 - 145 mmol/L 137  138  134   Potassium 3.5 - 5.1 mmol/L 4.2  4.1  3.8   Chloride 98 - 111 mmol/L 106  101  102   CO2 22 - 32 mmol/L 24  28  27    Calcium 8.9 - 10.3 mg/dL 8.4  8.4  8.0    Intake/Output      09/07 0701 09/08 0700 09/08 0701 09/09 0700   P.O.     Total Intake(mL/kg)     Urine (mL/kg/hr)     Total Output     Net             Physical Exam: General: NAD.  Laying in bed, calm, comfortable Resp: No increased wob Cardio: regular rate and rhythm ABD soft Neurologically intact MSK Neurovascularly intact Sensation intact distally Intact pulses distally Dorsiflexion/Plantar flexion intact  Calf soft and compressible  Incision: dressing C/D/I Ex fix in place Cast to LUE intact, can move fingers   I spoke with his RN about giving him Morphine prior to removing his  dressings. Once she did that, I removed the dressings completely. He tolerated that well. Upon removing the dressings and visualizing the laceration, I could see that the majority of it looks well healed without evidence of dehiscence or discharge. I began to remove the Nylon sutures which were more painful for him. Once I began to remove them from the middle portion of the wound, the edges began to pull away from each other resulting in gapping areas where I could see down into the subcutaneous tissue. The middle 1/3 of the incision does not look good at all. The subcutaneous tissue appears to be a mixture of serosanguinous fluid and pus into what looks liquefied rather than solid like normal tissue. There is a strong odor of infection present. I pressed on the areas around this portion and no discharge actively seeped out of the  wound rather it remained contained inside it. I decided to leave 2 sutures in place at the opposite ends of this middle area to prevent it from further dehiscence. I cleaned the large wound and placed new clean dressings over it which include Adaptic, 4x4s, ABDs, Kerlix, and ACE wraps. For the 4 pin sites, I was unable to find fresh xeroform so I left the existing xeroform there and placed fresh Cling wrap around each of them. The patient tolerated the entire process well.     Images of wound once dressings removed but prior to removing sutures        Assessment: 12 Days Post-Op  S/P Procedure(s) (LRB): ADJUST EXTERNAL FIXATION LEG (Left) CLOSED REDUCTION TIBIA (Left) PERCUTANEOUS PINNING VS. OPEN  WRIST (Left) by Dr. Timothy D. Murphy on 10/06/21  Principal Problem:   Left knee dislocation    Plan: Unfortunately due to the wound being infected, the patient is going to need to return to the OR on Saturday 10/21/21 at 7:30am for a left knee I&D with Dr. Varkey who is the on call provider. Hopefully there is no frank pus in the wound or knee joint. Due to the nature of  the original injury and the fact that his knee joint capsule was largely destroyed making it unable to be repaired during surgery, it is possible for infection to be there.  Will make NPO at midnight.    Advance diet Up with therapy as able while maintaining WB status Incentive Spirometry Elevate and Apply ice H/H very slightly decreased at 8.2 today. Has had Iron supplement for 2 days Continue lidocaine patches for sternum pain   Weightbearing: NWB LUE and LLE, able to use platform walker though Insicional and dressing care: Reinforce PRN Orthopedic device(s):  Ex fix likely for 6 weeks Showering: Keep dressing dry VTE prophylaxis: Lovenox 100mg daily while inpatient, can switch to Xarelto x 30 days upon d/c , SCDs, ambulation Pain control: Tylenol, Tramadol, Oxycodone, Morphine, lidocaine patches PRN   Dispo:  CIR hopefully on Sunday if surgery goes well and patient recovering appropriately. On call team will round on him.     Sesilia Poucher M Devondre Guzzetta, PA-C Office [phone] 10/20/2021, 2:49 PM  

## 2021-10-20 NOTE — Progress Notes (Signed)
Inpatient Rehab Admissions Coordinator:    Received secure chat from PA this pm that patient needs to return to surgery tomorrow for I & D of Left LE. Patient will not be able to admit to CIR until after that and when bed is available. Will continue to follow. Hope for potential admission Sunday or Monday.   Rehab Admissons Coordinator [place], [place], [place] [phone]
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
## 2021-10-20 NOTE — Plan of Care (Signed)

## 2021-10-21 DIAGNOSIS — E669 Obesity, unspecified: Secondary | ICD-10-CM

## 2021-10-21 DIAGNOSIS — S62102A Fracture of unspecified carpal bone, left wrist, initial encounter for closed fracture: Secondary | ICD-10-CM

## 2021-10-21 DIAGNOSIS — S83105A Unspecified dislocation of left knee, initial encounter: Secondary | ICD-10-CM | POA: Diagnosis not present

## 2021-10-21 DIAGNOSIS — M009 Pyogenic arthritis, unspecified: Secondary | ICD-10-CM

## 2021-10-21 DIAGNOSIS — Z113 Encounter for screening for infections with a predominantly sexual mode of transmission: Secondary | ICD-10-CM

## 2021-10-21 HISTORY — PX: I & D EXTREMITY: SHX5045

## 2021-10-21 HISTORY — PX: APPLICATION OF WOUND VAC: SHX5189

## 2021-10-21 NOTE — Progress Notes (Signed)
Peripherally Inserted Central Catheter Placement  The IV Nurse has discussed with the patient and/or persons authorized to consent for the patient, the purpose of this procedure and the potential benefits and risks involved with this procedure.  The benefits include less needle sticks, lab draws from the catheter, and the patient may be discharged home with the catheter. Risks include, but not limited to, infection, bleeding, blood clot (thrombus formation), and puncture of an artery; nerve damage and irregular heartbeat and possibility to perform a PICC exchange if needed/ordered by physician.  Alternatives to this procedure were also discussed.  Bard Power PICC patient education guide, fact sheet on infection prevention and patient information card has been provided to patient /or left at bedside.    PICC Placement Documentation  PICC Single Lumen 10/21/21 Right Cephalic 46 cm 0 cm (Active)  Indication for Insertion or Continuance of Line Home intravenous therapies (PICC only) 10/21/21 1913  Exposed Catheter (cm) 0 cm 10/21/21 1913  Site Assessment Clean, Dry, Intact 10/21/21 1913  Line Status Saline locked;Flushed;Blood return noted 10/21/21 1913  Dressing Type Transparent;Securing device 10/21/21 1913  Dressing Status Antimicrobial disc in place;Clean, Dry, Intact 10/21/21 1913  Safety Lock Not Applicable 10/21/21 1913  Line Care Connections checked and tightened 10/21/21 1913  Line Adjustment (NICU/IV Team Only) No 10/21/21 1913  Dressing Intervention New dressing 10/21/21 1913  Dressing Change Due 10/28/21 10/21/21 1913       Burnard Bunting Chenice 10/21/2021, 7:15 PM

## 2021-10-21 NOTE — Anesthesia Postprocedure Evaluation (Signed)
Anesthesia Post Note  Patient: Bradley [name]  Procedure(s) Performed: IRRIGATION AND DEBRIDEMENT LEFT KNEE (Left: Knee) APPLICATION OF WOUND VAC (Knee)     Patient location during evaluation: PACU Anesthesia Type: General Level of consciousness: awake Pain management: pain level controlled Vital Signs Assessment: post-procedure vital signs reviewed and stable Respiratory status: spontaneous breathing, nonlabored ventilation, respiratory function stable and patient connected to nasal cannula oxygen Cardiovascular status: blood pressure returned to baseline and stable Postop Assessment: no apparent nausea or vomiting Anesthetic complications: no   No notable events documented.  Last Vitals:  Vitals:   10/21/21 0949 10/21/21 1956  BP: 122/81 (!) 144/69  Pulse: 87 97  Resp: (!) 21 19  Temp: 36.8 C 36.9 C  SpO2: 100% 98%    Last Pain:  Vitals:   10/21/21 0949  TempSrc: Oral  PainSc:                  Darlisha Kelm P Yanis Juma

## 2021-10-21 NOTE — Interval H&P Note (Signed)
Due to the patients open knee injury, I was asked to wash out the knee this week as there is concern for infection. Patient understands the plan of care. All questions answered.

## 2021-10-21 NOTE — Transfer of Care (Signed)
Immediate Anesthesia Transfer of Care Note  Patient: Bradley [name]  Procedure(s) Performed: IRRIGATION AND DEBRIDEMENT LEFT KNEE (Left: Knee) APPLICATION OF WOUND VAC (Knee)  Patient Location: PACU  Anesthesia Type:General  Level of Consciousness: awake, alert  and oriented  Airway & Oxygen Therapy: Patient Spontanous Breathing and Patient connected to nasal cannula oxygen  Post-op Assessment: Report given to RN, Post -op Vital signs reviewed and stable and Patient moving all extremities  Post vital signs: Reviewed and stable  Last Vitals:  Vitals Value Taken Time  BP 143/65 10/21/21 0915  Temp    Pulse 85 10/21/21 0915  Resp 18 10/21/21 0915  SpO2 95 % 10/21/21 0915  Vitals shown include unvalidated device data.  Last Pain:  Vitals:   10/21/21 0703  TempSrc: Oral  PainSc:       Patients Stated Pain Goal: 2 (10/20/21 2300)  Complications: No notable events documented.
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
## 2021-10-21 NOTE — Op Note (Addendum)
Orthopaedic Surgery Operative Note (CSN: [id number])  Bradley [name]  1980/09/03 Date of Surgery: 10/21/2021   Diagnoses:  Left knee open knee dislocation with infection  Procedure: Left washout of septic knee Left incision and debridement of open wound Left knee wound VAC placement Left complex wound closure greater than 35 cm   Operative Finding Successful completion of the planned procedure.  Patient superficial wound was clearly infected.  After removal of the sutures the wound dehisced completely.  There was a clear track into the joint.  We were able to wash all of these areas out and remove any unhealthy tissue.  The laceration was complex and had skin loss inferiorly especially at the tibia anteriorly and was not able to be primarily closed.  We placed a wound VAC to try and decrease tissue swelling as well as a Hemovac to decompress the knee.  Patient will require an additional washout likely in 2 to 3 days.  We would get infectious disease to consult on the patient to help with management of his IV antibiotics.  I discussed with the family that there is a real chance that this is a nonsalvageable limb however we will continue to try and attempt to plan for primary closure of the wound.  Post-operative plan: The patient will be nonweightbearing with external fixator in place.  The patient will be readmitted to the floor.  DVT prophylaxis Lovenox 40 mg/day until mobilizing and then consider transition in clinic to alternative medicines.   Pain control with PRN pain medication preferring oral medicines.    Post-Op Diagnosis: Same Surgeons:Primary: Bjorn Pippin, MD Assistants:Caroline McBane PA-C Location: [facility] OR ROOM 06 Anesthesia: General with local anesthesia Antibiotics:  3 g Ancef, local 1 g vancomycin powder and 1.2 of tobramycin powder Tourniquet time:  Estimated Blood Loss: 50 Complications: None Specimens: 2 for culture Implants: * No implants in log *  Indications for  Surgery:   Bradley [name] is a 41 y.o. male with open knee dislocation about 2 weeks ago with concern for superficial infection upon dressing change yesterday.  Benefits and risks of operative and nonoperative management were discussed prior to surgery with patient/guardian(s) and informed consent form was completed.  Specific risks including infection, need for additional surgery, continued infection, loss of limb salvage   Procedure:   The patient was identified properly. Informed consent was obtained and the surgical site was marked. The patient was taken up to suite where general anesthesia was induced.  The patient was positioned supine on a regular bed.  The left leg was prepped and draped in the usual sterile fashion.  Timeout was performed before the beginning of the case.  We began by removing the previous stitches.  There is clear purulent material at the superficial aspect of the incompletely closed laceration.  The wound immediately dehisced upon removal of the stitches and gentle probing.  It was clear that there was purulent material that tracked into the joint.  We saw that there was a small window in the lateral parapatellar aspect of the repaired patellar tendon.  We removed any unhealthy appearing tissue as well as stitches that were not involved in the patellar tendon repair.  The patella tendon repair appeared to be intact.  Multiple clinical photos were obtained and placed in the chart.  We used a rondure as well as a scalpel to excisionally debride skin, fascia, muscle, tendon.  We used a Pulsavac to irrigate 6 L through the joint itself and then Pulsavac another  3 L in the superficial tissues.  We placed local vancomycin powder into the knee joint as well as in the superficial tissues.  We placed a Hemovac drain into the knee.  At that point was clear that the incision could not be closed primarily throughout.  We used a series of trauma sutures to retain a piece of wound VAC  material that was placed in the incision where it was not able to be primarily closed.  This allowed appropriate drainage of fluid to attempt to close the incision in a later date.  VAC held appropriate suction.  Dressings were replaced.  External fixator was not disturbed throughout the case and had been prepped into the field but the pin sites were redressed at the end of the case.  Patient awoken and taken to PACU in stable condition.  Alfonse Alpers, PA-C, present and scrubbed throughout the case, critical for completion in a timely fashion, and for retraction, instrumentation, closure.  Debridement type: Excisional Debridement  Side: left  Body Location: Knee   Tools used for debridement: scalpel, scissors, and rongeur  Pre-debridement Wound size (cm):   Length: 35        Width: 8     Depth: 5   Post-debridement Wound size (cm):   Length: 35        Width: 8     Depth: 5   Debridement depth beyond dead/damaged tissue down to healthy viable tissue: yes  Tissue layer involved: skin, subcutaneous tissue, muscle / fascia  Nature of tissue removed: Slough, Necrotic, Devitalized Tissue, and Purulence  Irrigation volume: 9L     Irrigation fluid type: Normal Saline

## 2021-10-21 NOTE — Plan of Care (Signed)

## 2021-10-21 NOTE — Anesthesia Preprocedure Evaluation (Addendum)
Anesthesia Evaluation  Patient identified by MRN, date of birth, ID band Patient awake    Reviewed: Allergy & Precautions, NPO status , Patient's Chart, lab work & pertinent test results  Airway Mallampati: II  TM Distance: >3 FB Neck ROM: Full    Dental no notable dental hx.    Pulmonary neg pulmonary ROS,    Pulmonary exam normal        Cardiovascular negative cardio ROS Normal cardiovascular exam     Neuro/Psych negative neurological ROS  negative psych ROS   GI/Hepatic negative GI ROS, Neg liver ROS,   Endo/Other  Morbid obesity  Renal/GU negative Renal ROS     Musculoskeletal negative musculoskeletal ROS (+)   Abdominal (+) + obese,   Peds  Hematology  (+) Blood dyscrasia, anemia ,   Anesthesia Other Findings Left Knee Wound  Reproductive/Obstetrics                            Anesthesia Physical Anesthesia Plan  ASA: 3  Anesthesia Plan: Regional   Post-op Pain Management:    Induction: Intravenous  PONV Risk Score and Plan: 1 and Ondansetron, Dexamethasone, Midazolam and Treatment may vary due to age or medical condition  Airway Management Planned: Oral ETT  Additional Equipment:   Intra-op Plan:   Post-operative Plan: Extubation in OR  Informed Consent: I have reviewed the patients History and Physical, chart, labs and discussed the procedure including the risks, benefits and alternatives for the proposed anesthesia with the patient or authorized representative who has indicated his/her understanding and acceptance.     Dental advisory given  Plan Discussed with: CRNA  Anesthesia Plan Comments:        Anesthesia Quick Evaluation

## 2021-10-21 NOTE — Anesthesia Procedure Notes (Signed)
Procedure Name: Intubation Date/Time: 10/21/2021 7:45 AM  Performed by: [name], CRNAPre-anesthesia Checklist: Patient identified, Emergency Drugs available, Suction available and Patient being monitored Patient Re-evaluated:Patient Re-evaluated prior to induction Oxygen Delivery Method: Circle system utilized Preoxygenation: Pre-oxygenation with 100% oxygen Induction Type: IV induction and Rapid sequence Laryngoscope Size: Glidescope and 4 Tube type: Oral Tube size: 7.5 mm Number of attempts: 1 Airway Equipment and Method: Stylet and Video-laryngoscopy Placement Confirmation: ETT inserted through vocal cords under direct vision, positive ETCO2 and breath sounds checked- equal and bilateral Secured at: 23 cm Tube secured with: Tape Dental Injury: Teeth and Oropharynx as per pre-operative assessment  Comments: Electively used Glidescope, Grade I view on screen.

## 2021-10-22 DIAGNOSIS — S83105D Unspecified dislocation of left knee, subsequent encounter: Secondary | ICD-10-CM

## 2021-10-22 DIAGNOSIS — S5290XA Unspecified fracture of unspecified forearm, initial encounter for closed fracture: Secondary | ICD-10-CM

## 2021-10-22 DIAGNOSIS — M009 Pyogenic arthritis, unspecified: Secondary | ICD-10-CM | POA: Diagnosis not present

## 2021-10-22 DIAGNOSIS — S72002B Fracture of unspecified part of neck of left femur, initial encounter for open fracture type I or II: Secondary | ICD-10-CM

## 2021-10-22 NOTE — Progress Notes (Signed)
   ORTHOPAEDIC PROGRESS NOTE  s/p Procedure(s): IRRIGATION AND DEBRIDEMENT LEFT KNEE APPLICATION OF WOUND VAC  SUBJECTIVE: Reports mild pain about operative site. No chest pain. No SOB. No nausea/vomiting. No other complaints.  Patient expressed some concerns about the possibility of needing an above-knee amputation.  OBJECTIVE: PE: Dressings intact left leg, VAC holding suction.  Hemovac in place.  Vitals:   10/21/21 2346 10/22/21 0359  BP: (!) 141/88 135/73  Pulse: 91 81  Resp: 19 17  Temp: 98.3 F (36.8 C) 98.4 F (36.9 C)  SpO2: 96% 100%     ASSESSMENT: Bradley [name] is a 41 y.o. male doing well postoperatively.  We had a long discussion about his long-term options.  He understands he will need another surgery on Tuesday likely with Dr. Murphy.  He also understands that amputation is a real possibility considering his extensive tissue damage and possibility for need for multiple future surgeries.  Plastic surgery may get involved.  We also discussed his nutritional status.  We will obtain a dietitian consult to try and help him with the education portion of how to improve his diet and maximize nutrition while in the setting of acute trauma going forward.   PLAN: Weightbearing: NWB LLE Insicional and dressing care: Reinforce dressings as needed, continue Hemovac and wound VAC Orthopedic device(s): Wound Vac: Knee and medium Hemovac in knee joint Showering: None VTE prophylaxis: Lovenox 40mg qd  Pain control: As needed medicines Follow - up plan:  Patient will likely go back to the operating room on Tuesday. Contact information:  Weekdays 8-5 Caroline McBane PA-C [phone], After hours and holidays please check [email or web address] for group call information for [facility]     
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
## 2021-10-22 NOTE — H&P (View-Only) (Signed)
   ORTHOPAEDIC PROGRESS NOTE  s/p Procedure(s): IRRIGATION AND DEBRIDEMENT LEFT KNEE APPLICATION OF WOUND VAC  SUBJECTIVE: Reports mild pain about operative site. No chest pain. No SOB. No nausea/vomiting. No other complaints.  Patient expressed some concerns about the possibility of needing an above-knee amputation.  OBJECTIVE: PE: Dressings intact left leg, VAC holding suction.  Hemovac in place.  Vitals:   10/21/21 2346 10/22/21 0359  BP: (!) 141/88 135/73  Pulse: 91 81  Resp: 19 17  Temp: 98.3 F (36.8 C) 98.4 F (36.9 C)  SpO2: 96% 100%     ASSESSMENT: Bradley [name] is a 41 y.o. male doing well postoperatively.  We had a long discussion about his long-term options.  He understands he will need another surgery on Tuesday likely with Dr. Eulah Pont.  He also understands that amputation is a real possibility considering his extensive tissue damage and possibility for need for multiple future surgeries.  Plastic surgery may get involved.  We also discussed his nutritional status.  We will obtain a dietitian consult to try and help him with the education portion of how to improve his diet and maximize nutrition while in the setting of acute trauma going forward.   PLAN: Weightbearing: NWB LLE Insicional and dressing care: Reinforce dressings as needed, continue Hemovac and wound VAC Orthopedic device(s): Wound Vac: Knee and medium Hemovac in knee joint Showering: None VTE prophylaxis: Lovenox 40mg  qd  Pain control: As needed medicines Follow - up plan:  Patient will likely go back to the operating room on Tuesday. Contact information:  Weekdays 8-5 10-16-2005 PA-C [phone], After hours and holidays please check [email or web address] for group call information for [facility]

## 2021-10-23 DIAGNOSIS — T8149XA Infection following a procedure, other surgical site, initial encounter: Secondary | ICD-10-CM | POA: Insufficient documentation

## 2021-10-23 NOTE — Progress Notes (Signed)
Inpatient Rehab Admissions Coordinator:    Patient has medical issues creating barrier to CIR admission. Will continue to monitor and plan to pursue admission to CIR when patient is medically ready and bed available. Patient's insurance authorization is good until 10/31/21.   Rehab Admissons Coordinator [place], [place], [place] [phone]

## 2021-10-23 NOTE — H&P (View-Only) (Signed)
Subjective: Patient reports pain as moderate. Worse with little movements. Chest continues to feel much better since using lidocaine patches. Tolerating diet. Still no major BM. Urinating. No SOB. Continues to work with PT on mobilizing OOB and is making good progress. Very motivated.  Cultures growing Enterococcus faecalis and Achromobacter denitrificans   Objective:   VITALS:   Vitals:   10/22/21 0800 10/22/21 2002 10/23/21 0500 10/23/21 0833  BP: 136/78 (!) 154/78 125/64 127/69  Pulse:  73 87 76  Resp:  19 18 18   Temp: 98.2 F (36.8 C) 98.2 F (36.8 C) 98.5 F (36.9 C) 98 F (36.7 C)  TempSrc: Oral  Oral   SpO2: 100% 100%  100%  Weight:      Height:          Latest Ref Rng & Units 10/23/2021    3:01 AM 10/22/2021    2:57 AM 10/20/2021   12:22 AM  CBC  WBC 4.0 - 10.5 K/uL 6.8  10.6  9.1   Hemoglobin 13.0 - 17.0 g/dL 7.5  7.6  8.2   Hematocrit 39.0 - 52.0 % 24.1  24.5  26.0   Platelets 150 - 400 K/uL 410  454  468       Latest Ref Rng & Units 10/22/2021    2:57 AM 10/15/2021   12:42 AM 10/13/2021    7:09 AM  BMP  Glucose 70 - 99 mg/dL 12/13/2021  628  366   BUN 6 - 20 mg/dL 20  16  17    Creatinine 0.61 - 1.24 mg/dL 294   7.65   Sodium 135 - 145 mmol/L 136  137  138   Potassium 3.5 - 5.1 mmol/L 3.8  4.2  4.1   Chloride 98 - 111 mmol/L 105  106  101   CO2 22 - 32 mmol/L 24  24  28    Calcium 8.9 - 10.3 mg/dL 8.7  8.4  8.4    Intake/Output      09/10 0701 09/11 0700 09/11 0701 09/12 0700   P.O.  240   I.V. (mL/kg) 599.4 (2.9)    IV Piggyback 170.1    Total Intake(mL/kg) 769.4 (3.8) 240 (1.2)   Urine (mL/kg/hr) 0 (0)    Drains 130    Stool 0    Blood     Total Output 130    Net +639.4 +240        Urine Occurrence 2 x    Stool Occurrence 1 x       Physical Exam: General: NAD.  Laying in bed, calm, comfortable Resp: No increased wob Cardio: regular rate and rhythm ABD soft Neurologically intact MSK Neurovascularly intact Sensation intact  distally Intact pulses distally Dorsiflexion/Plantar flexion intact  Calf soft and compressible  Incision: dressing C/D/I Ex fix in place Cast to LUE intact, can move fingers Wound vac running with ~ 200cc serosanguinous fluid in canister Hemovac drain compressed with minimal blood in it     Assessment:   S/P LEFT KNEE REDUCTION AND PLACEMENT OF EXTERNAL FIXATOR  By Dr. 11/11 on 10/06/21  S/P Procedure(s) (LRB): IRRIGATION AND DEBRIDEMENT KNEE (Left) by Dr. 11/12 on 10/21/21  Principal Problem:   Left knee dislocation    Plan: Unfortunately due to the wound being infected, the patient is going to need to return to the OR multiple times this week for a left knee I&D. Will remove wound vac and drain to examine wound. May need to replace new ones.  Will make NPO at midnight.    Advance diet Up with therapy as able while maintaining WB status Incentive Spirometry Elevate and Apply ice H/H down to 7.5 today. Has Iron supplement  Continue lidocaine patches for sternum pain Monitor specimen results   Weightbearing: NWB LUE and LLE, able to use platform walker though Insicional and dressing care: Reinforce PRN Orthopedic device(s):  Ex fix likely for 6 weeks Showering: Keep dressing dry VTE prophylaxis: Lovenox 100mg  daily while inpatient, can switch to Xarelto x 30 days upon d/c , SCDs, ambulation Pain control: Tylenol, Tramadol, Oxycodone, Morphine, lidocaine patches PRN ABX: per ID via PICC line   Dispo:  Repeat I&D tomorrow afternoon. May need to replace new wound vac vs being able to close the wound depending on the amount of tissue loss. Will consider getting plastics involved if unable to do primary closure.     , PA-C Office [phone] 10/23/2021, 11:24 AM
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
## 2021-10-24 DIAGNOSIS — S83105D Unspecified dislocation of left knee, subsequent encounter: Secondary | ICD-10-CM | POA: Diagnosis not present

## 2021-10-24 HISTORY — PX: I & D EXTREMITY: SHX5045

## 2021-10-24 NOTE — Interval H&P Note (Signed)
History and Physical Interval Note:  10/24/2021 7:02 AM  Bradley [name]  has presented today for surgery, with the diagnosis of Left knee wound.  The various methods of treatment have been discussed with the patient and family. After consideration of risks, benefits and other options for treatment, the patient has consented to  Procedure(s): IRRIGATION AND DEBRIDEMENT KNEE (Left) as a surgical intervention.  The patient's history has been reviewed, patient examined, no change in status, stable for surgery.  I have reviewed the patient's chart and labs.  Questions were answered to the patient's satisfaction.     Sheral Apley
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
## 2021-10-24 NOTE — Anesthesia Procedure Notes (Signed)
Procedure Name: Intubation Date/Time: 10/24/2021 3:40 PM  Performed by: Tressia Miners, CRNAPre-anesthesia Checklist: Patient identified, Emergency Drugs available, Suction available and Patient being monitored Patient Re-evaluated:Patient Re-evaluated prior to induction Oxygen Delivery Method: Circle System Utilized Preoxygenation: Pre-oxygenation with 100% oxygen Induction Type: IV induction Ventilation: Mask ventilation without difficulty Laryngoscope Size: Glidescope and 4 Grade View: Grade I Tube type: Oral Number of attempts: 1 Airway Equipment and Method: Stylet and Video-laryngoscopy Placement Confirmation: ETT inserted through vocal cords under direct vision, positive ETCO2 and breath sounds checked- equal and bilateral Secured at: 24 cm Tube secured with: Tape Dental Injury: Teeth and Oropharynx as per pre-operative assessment

## 2021-10-24 NOTE — Plan of Care (Signed)

## 2021-10-24 NOTE — Plan of Care (Signed)

## 2021-10-24 NOTE — Transfer of Care (Signed)
Immediate Anesthesia Transfer of Care Note  Patient: Bradley [name]  Procedure(s) Performed: IRRIGATION AND DEBRIDEMENT KNEE (Left)  Patient Location: PACU  Anesthesia Type:General  Level of Consciousness: awake and alert   Airway & Oxygen Therapy: Patient Spontanous Breathing  Post-op Assessment: Report given to RN and Post -op Vital signs reviewed and stable  Post vital signs: Reviewed and stable  Last Vitals:  Vitals Value Taken Time  BP 125/61 10/24/21 17:16  Temp    Pulse 71 10/24/21 17:16  Resp 16 10/24/21 17:16  SpO2 99 10/24/21 17:16    Last Pain:  Vitals:   10/24/21 1452  TempSrc:   PainSc: 0-No pain      Patients Stated Pain Goal: 2 (10/24/21 1452)  Complications: No notable events documented.
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
## 2021-10-24 NOTE — Anesthesia Preprocedure Evaluation (Signed)
Anesthesia Evaluation  Patient identified by MRN, date of birth, ID band Patient awake    Reviewed: Allergy & Precautions, NPO status , Patient's Chart, lab work & pertinent test results  Airway Mallampati: II  TM Distance: >3 FB Neck ROM: Full    Dental no notable dental hx. (+) Dental Advisory Given   Pulmonary neg pulmonary ROS,    Pulmonary exam normal        Cardiovascular negative cardio ROS Normal cardiovascular exam     Neuro/Psych negative neurological ROS  negative psych ROS   GI/Hepatic negative GI ROS, Neg liver ROS,   Endo/Other  Morbid obesity  Renal/GU negative Renal ROS     Musculoskeletal negative musculoskeletal ROS (+)   Abdominal (+) + obese,   Peds  Hematology  (+) Blood dyscrasia, anemia ,   Anesthesia Other Findings Left Knee Wound  Reproductive/Obstetrics                             Anesthesia Physical  Anesthesia Plan  ASA: 3  Anesthesia Plan: General   Post-op Pain Management: Ofirmev IV (intra-op)* and Toradol IV (intra-op)*   Induction: Intravenous  PONV Risk Score and Plan: 3 and Ondansetron, Dexamethasone, Midazolam and Treatment may vary due to age or medical condition  Airway Management Planned: Oral ETT  Additional Equipment:   Intra-op Plan:   Post-operative Plan: Extubation in OR  Informed Consent:   Plan Discussed with:   Anesthesia Plan Comments:         Anesthesia Quick Evaluation

## 2021-10-24 NOTE — Interval H&P Note (Signed)
History and Physical Interval Note:  10/24/2021 3:06 PM  Bradley [name]  has presented today for surgery, with the diagnosis of Left knee wound.  The various methods of treatment have been discussed with the patient and family. After consideration of risks, benefits and other options for treatment, the patient has consented to  Procedure(s): IRRIGATION AND DEBRIDEMENT KNEE (Left) as a surgical intervention.  The patient's history has been reviewed, patient examined, no change in status, stable for surgery.  I have reviewed the patient's chart and labs.  Questions were answered to the patient's satisfaction.     Sheral Apley

## 2021-10-24 NOTE — Anesthesia Postprocedure Evaluation (Signed)
Anesthesia Post Note  Patient: Bradley [name]  Procedure(s) Performed: IRRIGATION AND DEBRIDEMENT KNEE (Left)     Patient location during evaluation: PACU Anesthesia Type: General Level of consciousness: sedated and patient cooperative Pain management: pain level controlled Vital Signs Assessment: post-procedure vital signs reviewed and stable Respiratory status: spontaneous breathing Cardiovascular status: stable Anesthetic complications: no   No notable events documented.  Last Vitals:  Vitals:   10/24/21 1745 10/24/21 1851  BP: (!) 141/73 (!) 148/83  Pulse: 74 88  Resp: 18 18  Temp: 36.4 C 36.8 C  SpO2: 95% 99%    Last Pain:  Vitals:   10/24/21 1851  TempSrc: Oral  PainSc:                  Lewie Loron
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
## 2021-10-25 NOTE — Progress Notes (Signed)
Subjective: Patient reports pain as moderate. Worse with little movements. Chest continues to feel much better since using lidocaine patches. Tolerating diet. Still no major BM. Urinating. No SOB. Continues to work with PT on mobilizing OOB and is making good progress. Very motivated.  Cultures growing Enterococcus faecalis and Achromobacter denitrificans   Objective:   VITALS:   Vitals:   10/24/21 2350 10/25/21 0404 10/25/21 0844 10/25/21 1326  BP: 126/71 130/68 123/66 (!) 121/52  Pulse: 88 75 76 79  Resp: 19 19 19 18   Temp: 98.2 F (36.8 C) 98.5 F (36.9 C) 97.8 F (36.6 C) 97.7 F (36.5 C)  TempSrc:      SpO2: 99% 100% 100% 100%  Weight:      Height:          Latest Ref Rng & Units 10/25/2021    3:03 AM 10/23/2021    3:01 AM 10/22/2021    2:57 AM  CBC  WBC 4.0 - 10.5 K/uL 9.8  6.8  10.6   Hemoglobin 13.0 - 17.0 g/dL 8.1  7.5  7.6   Hematocrit 39.0 - 52.0 % 26.1  24.1  24.5   Platelets 150 - 400 K/uL 475  410  454       Latest Ref Rng & Units 10/25/2021    3:03 AM 10/22/2021    2:57 AM 10/15/2021   12:42 AM  BMP  Glucose 70 - 99 mg/dL 12/15/2021  607  371   BUN 6 - 20 mg/dL 17  20  16    Creatinine 0.61 - 1.24 mg/dL 062   6.94   Sodium 135 - 145 mmol/L 135  136  137   Potassium 3.5 - 5.1 mmol/L 4.4  3.8  4.2   Chloride 98 - 111 mmol/L 104  105  106   CO2 22 - 32 mmol/L 25  24  24    Calcium 8.9 - 10.3 mg/dL 9.0  8.7  8.4    Intake/Output      09/12 0701 09/13 0700 09/13 0701 09/14 0700   P.O.  240   I.V. (mL/kg) 800 (4.3)    IV Piggyback 450    Total Intake(mL/kg) 1250 (6.7) 240 (1.3)   Urine (mL/kg/hr) 500 (0.1) 825 (0.6)   Drains 0    Blood 50    Total Output 550 825   Net +700 -585             Assessment: 1 Day Post-Op S/P LEFT KNEE REDUCTION AND PLACEMENT OF EXTERNAL FIXATOR  By Dr. 10/13 on 10/06/21  S/P Procedure(s) (LRB): IRRIGATION AND DEBRIDEMENT KNEE (Left) by Dr. 10/14 on 10/21/21  Principal Problem:   Left knee dislocation Active  Problems:   Wound infection after surgery, left knee    Plan: Will return to OR for one last left knee I&D. Hopefully last one needed. Will remove wound vac to examine wound. Have consulted with plastics team to give opinion on wound closure and possible grafting procedure.   Will make NPO at midnight.    Advance diet Up with therapy as able while maintaining WB status Incentive Spirometry Elevate and Apply ice H/H 8.1 today. Has Iron supplement  Continue lidocaine patches for sternum pain Monitor specimen results   Weightbearing: NWB LUE and LLE, able to use platform walker though Insicional and dressing care: Reinforce PRN Orthopedic device(s):  Ex fix likely for 6 weeks Showering: Keep dressing dry VTE prophylaxis: Lovenox 100mg  daily while inpatient, can switch to Xarelto x 30  days upon d/c , SCDs, ambulation Pain control: Tylenol, Tramadol, Oxycodone, Morphine, lidocaine patches PRN ABX: per ID via PICC line   Dispo: Continue to work hard with PT/OT. OR Friday morning with Dr. Blanchie Dessert. CIR once cleared.      Bradley Pane, PA-C Office [phone] 10/25/2021, 2:14 PM
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
## 2021-10-25 NOTE — Progress Notes (Signed)
Inpatient Rehab Admissions Coordinator:   Note plans for possible repeat I&D 9/15.  We will continue to follow for timing of potential transfer to CIR, pending completion of acute course.  Note insurance auth good through 9/19.    Estill Dooms, PT, DPT Admissions Coordinator [phone] 10/25/21  10:23 AM

## 2021-10-26 NOTE — Progress Notes (Signed)
Inpatient Rehab Admissions Coordinator:   Note plans for multiple trips to OR for washout to clear infection.  We will continue to follow from a distance for post-acute rehab needs.   Estill Dooms, PT, DPT Admissions Coordinator [phone] 10/26/21  10:48 AM

## 2021-10-26 NOTE — Progress Notes (Signed)
Mobility Specialist Progress Note   10/26/21 1620  Mobility  Activity Transferred from chair to bed  Level of Assistance Other (Comment) (+3A(safety/equipment))  Assistive Device Sliding board  LLE Weight Bearing NWB  Distance Ambulated (ft) 2 ft  Activity Response Tolerated well  $Mobility charge 1 Mobility   Pt requesting assistance to get from chair to bed d/t general discomfort from sitting. Required +3A for safety/pain tolerance to get to bed via sliding board. No faults throughout, pt left in bed with all needs met, and call bell in reach.  Holland Falling Mobility Specialist MS [facility] #:  [phone] Acute Rehab Office:  [phone]

## 2021-10-26 NOTE — Progress Notes (Signed)
  Subjective: Patient reports pain as moderate. Chest continues to feel much better since using lidocaine patches. Tolerating diet. Feels much better after finally having a major BM today. Urinating. No SOB. Continues to work with PT on mobilizing OOB and is making good progress. Very motivated.  Cultures growing Enterococcus faecalis and Achromobacter denitrificans. On IV ABX via PICC line.    Objective:   VITALS:   Vitals:   10/25/21 0844 10/25/21 1326 10/25/21 2122 10/26/21 1143  BP: 123/66 (!) 121/52 (!) 141/74 133/81  Pulse: 76 79 87 76  Resp: 19 18 17 17  Temp: 97.8 F (36.6 C) 97.7 F (36.5 C) 98.1 F (36.7 C) 97.9 F (36.6 C)  TempSrc:   Oral Oral  SpO2: 100% 100% 100% 100%  Weight:      Height:          Latest Ref Rng & Units 10/26/2021    3:58 AM 10/25/2021    5:49 PM 10/25/2021    3:03 AM  CBC  WBC 4.0 - 10.5 K/uL 7.2  8.9  9.8   Hemoglobin 13.0 - 17.0 g/dL 8.3  8.2  8.1   Hematocrit 39.0 - 52.0 % 26.3  27.3  26.1   Platelets 150 - 400 K/uL 415  472  475       Latest Ref Rng & Units 10/25/2021    3:03 AM 10/22/2021    2:57 AM 10/15/2021   12:42 AM  BMP  Glucose 70 - 99 mg/dL 115  108  110   BUN 6 - 20 mg/dL 17  20  16   Creatinine 0.61 - 1.24 mg/dL 1.02  1.05  1.02   Sodium 135 - 145 mmol/L 135  136  137   Potassium 3.5 - 5.1 mmol/L 4.4  3.8  4.2   Chloride 98 - 111 mmol/L 104  105  106   CO2 22 - 32 mmol/L 25  24  24   Calcium 8.9 - 10.3 mg/dL 9.0  8.7  8.4    Intake/Output      09/13 0701 09/14 0700 09/14 0701 09/15 0700   P.O. 480    I.V. (mL/kg)  10 (0.1)   IV Piggyback     Total Intake(mL/kg) 480 (2.6) 10 (0.1)   Urine (mL/kg/hr) 1525 (0.3)    Drains     Blood     Total Output 1525    Net -1045 +10        Urine Occurrence  1 x   Stool Occurrence  1 x      Physical Exam: General: NAD.  Laying in bed, calm, comfortable  Resp: No increased wob Cardio: regular rate and rhythm ABD soft Neurologically intact MSK Neurovascularly  intact Sensation intact distally Intact pulses distally Dorsiflexion/Plantar flexion intact  Calf soft and compressible  Incision: dressing C/D/I Ex fix in place Cast to LUE intact, can move fingers Wound vac running with ~ 200cc serosanguinous fluid in canister       Assessment: 2 Days Post-Op S/P LEFT KNEE REDUCTION AND PLACEMENT OF EXTERNAL FIXATOR  By Dr. Murphy on 10/06/21  S/P LEFT KNEE REDUCTION AND PLACEMENT OF ADDITIONAL PINS IN EXTERNAL FIXATOR  By Dr. Murphy on 10/07/21  S/P Procedure(s) (LRB): IRRIGATION AND DEBRIDEMENT KNEE (Left) by Dr. Varkey on 10/21/21  S/P Procedure(s) (LRB): IRRIGATION AND DEBRIDEMENT KNEE (Left) by Dr. Murphy on 10/24/21  Principal Problem:   Left knee dislocation Active Problems:   Wound infection after surgery, left knee      Plan: OR with Dr. Blanchie Dessert for additional left knee I&D. Will remove wound vac to examine wound. Will get input from plastics team. Hopefully last I&D that will be needed. Will make NPO at midnight.    Advance diet Up with therapy as able while maintaining WB status Incentive Spirometry Elevate and Apply ice H/H 8.3 today. Low but has been stable. Has Iron supplement  Continue lidocaine patches for sternum pain Monitor specimen results   Weightbearing: NWB LUE and LLE, able to use platform walker though Insicional and dressing care: Reinforce PRN Orthopedic device(s):  Ex fix likely for 6 weeks Showering: Keep dressing dry VTE prophylaxis: Lovenox 100mg  daily while inpatient, can switch to Xarelto x 30 days upon d/c , SCDs, ambulation Pain control: Tylenol, Tramadol, Oxycodone, Morphine, lidocaine patches PRN ABX: per ID via PICC line   Dispo:  Repeat I&D tomorrow morning with Dr. . Wound closure pending plastics input. CIR once cleared.     Blanchie Dessert, PA-C Office [phone] 10/26/2021, 5:19 PM

## 2021-10-26 NOTE — H&P (View-Only) (Signed)
Subjective: Patient reports pain as moderate. Chest continues to feel much better since using lidocaine patches. Tolerating diet. Feels much better after finally having a major BM today. Urinating. No SOB. Continues to work with PT on mobilizing OOB and is making good progress. Very motivated.  Cultures growing Enterococcus faecalis and Achromobacter denitrificans. On IV ABX via PICC line.    Objective:   VITALS:   Vitals:   10/25/21 0844 10/25/21 1326 10/25/21 2122 10/26/21 1143  BP: 123/66 (!) 121/52 (!) 141/74 133/81  Pulse: 76 79 87 76  Resp: 19 18 17 17   Temp: 97.8 F (36.6 C) 97.7 F (36.5 C) 98.1 F (36.7 C) 97.9 F (36.6 C)  TempSrc:   Oral Oral  SpO2: 100% 100% 100% 100%  Weight:      Height:          Latest Ref Rng & Units 10/26/2021    3:58 AM 10/25/2021    5:49 PM 10/25/2021    3:03 AM  CBC  WBC 4.0 - 10.5 K/uL 7.2  8.9  9.8   Hemoglobin 13.0 - 17.0 g/dL 8.3  8.2  8.1   Hematocrit 39.0 - 52.0 % 26.3  27.3  26.1   Platelets 150 - 400 K/uL 415  472  475       Latest Ref Rng & Units 10/25/2021    3:03 AM 10/22/2021    2:57 AM 10/15/2021   12:42 AM  BMP  Glucose 70 - 99 mg/dL 12/15/2021  867  619   BUN 6 - 20 mg/dL 17  20  16    Creatinine 0.61 - 1.24 mg/dL 509   3.26   Sodium 135 - 145 mmol/L 135  136  137   Potassium 3.5 - 5.1 mmol/L 4.4  3.8  4.2   Chloride 98 - 111 mmol/L 104  105  106   CO2 22 - 32 mmol/L 25  24  24    Calcium 8.9 - 10.3 mg/dL 9.0  8.7  8.4    Intake/Output      09/13 0701 09/14 0700 09/14 0701 09/15 0700   P.O. 480    I.V. (mL/kg)  10 (0.1)   IV Piggyback     Total Intake(mL/kg) 480 (2.6) 10 (0.1)   Urine (mL/kg/hr) 1525 (0.3)    Drains     Blood     Total Output 1525    Net -1045 +10        Urine Occurrence  1 x   Stool Occurrence  1 x      Physical Exam: General: NAD.  Laying in bed, calm, comfortable  Resp: No increased wob Cardio: regular rate and rhythm ABD soft Neurologically intact MSK Neurovascularly  intact Sensation intact distally Intact pulses distally Dorsiflexion/Plantar flexion intact  Calf soft and compressible  Incision: dressing C/D/I Ex fix in place Cast to LUE intact, can move fingers Wound vac running with ~ 200cc serosanguinous fluid in canister       Assessment: 2 Days Post-Op S/P LEFT KNEE REDUCTION AND PLACEMENT OF EXTERNAL FIXATOR  By Dr. 10/14 on 10/06/21  S/P LEFT KNEE REDUCTION AND PLACEMENT OF ADDITIONAL PINS IN EXTERNAL FIXATOR  By Dr. 10/15 on 10/07/21  S/P Procedure(s) (LRB): IRRIGATION AND DEBRIDEMENT KNEE (Left) by Dr. 10/08/21 on 10/21/21  S/P Procedure(s) (LRB): IRRIGATION AND DEBRIDEMENT KNEE (Left) by Dr. 10/09/21 on 10/24/21  Principal Problem:   Left knee dislocation Active Problems:   Wound infection after surgery, left knee  Plan: OR with Dr. Blanchie Dessert for additional left knee I&D. Will remove wound vac to examine wound. Will get input from plastics team. Hopefully last I&D that will be needed. Will make NPO at midnight.    Advance diet Up with therapy as able while maintaining WB status Incentive Spirometry Elevate and Apply ice H/H 8.3 today. Low but has been stable. Has Iron supplement  Continue lidocaine patches for sternum pain Monitor specimen results   Weightbearing: NWB LUE and LLE, able to use platform walker though Insicional and dressing care: Reinforce PRN Orthopedic device(s):  Ex fix likely for 6 weeks Showering: Keep dressing dry VTE prophylaxis: Lovenox 100mg  daily while inpatient, can switch to Xarelto x 30 days upon d/c , SCDs, ambulation Pain control: Tylenol, Tramadol, Oxycodone, Morphine, lidocaine patches PRN ABX: per ID via PICC line   Dispo:  Repeat I&D tomorrow morning with Dr. . Wound closure pending plastics input. CIR once cleared.     Blanchie Dessert, PA-C Office [phone] 10/26/2021, 5:19 PM
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
## 2021-10-27 HISTORY — PX: I & D EXTREMITY: SHX5045

## 2021-10-27 NOTE — Anesthesia Postprocedure Evaluation (Signed)
Anesthesia Post Note  Patient: Bradley [name]  Procedure(s) Performed: IRRIGATION AND DEBRIDEMENT KNEE (Left: Knee)     Patient location during evaluation: PACU Anesthesia Type: General Level of consciousness: awake and alert Pain management: pain level controlled Vital Signs Assessment: post-procedure vital signs reviewed and stable Respiratory status: spontaneous breathing, nonlabored ventilation and respiratory function stable Cardiovascular status: stable and blood pressure returned to baseline Anesthetic complications: no   No notable events documented.  Last Vitals:  Vitals:   10/27/21 0945 10/27/21 1003  BP: 139/87 134/79  Pulse: 94 87  Resp: (!) 21 17  Temp:  36.9 C  SpO2: 95% 99%                 Beryle Lathe

## 2021-10-27 NOTE — Anesthesia Preprocedure Evaluation (Signed)
Anesthesia Evaluation  Patient identified by MRN, date of birth, ID band Patient awake    Reviewed: Allergy & Precautions, H&P , NPO status , Patient's Chart, lab work & pertinent test results  Airway Mallampati: II   Neck ROM: full    Dental   Pulmonary neg pulmonary ROS,    breath sounds clear to auscultation       Cardiovascular negative cardio ROS   Rhythm:regular Rate:Normal     Neuro/Psych    GI/Hepatic   Endo/Other  Morbid obesity  Renal/GU      Musculoskeletal   Abdominal   Peds  Hematology  (+) Blood dyscrasia, anemia ,   Anesthesia Other Findings   Reproductive/Obstetrics                             Anesthesia Physical Anesthesia Plan  ASA: 2  Anesthesia Plan: General   Post-op Pain Management:    Induction: Intravenous  PONV Risk Score and Plan: 2 and Ondansetron, Dexamethasone, Midazolam and Treatment may vary due to age or medical condition  Airway Management Planned: LMA  Additional Equipment:   Intra-op Plan:   Post-operative Plan: Extubation in OR  Informed Consent: I have reviewed the patients History and Physical, chart, labs and discussed the procedure including the risks, benefits and alternatives for the proposed anesthesia with the patient or authorized representative who has indicated his/her understanding and acceptance.     Dental advisory given  Plan Discussed with: CRNA, Anesthesiologist and Surgeon  Anesthesia Plan Comments:         Anesthesia Quick Evaluation

## 2021-10-27 NOTE — Anesthesia Procedure Notes (Signed)
Procedure Name: LMA Insertion Date/Time: 10/27/2021 7:45 AM  Performed by: Caren Macadam, CRNAPre-anesthesia Checklist: Patient identified, Emergency Drugs available, Suction available and Patient being monitored Patient Re-evaluated:Patient Re-evaluated prior to induction Oxygen Delivery Method: Circle system utilized Preoxygenation: Pre-oxygenation with 100% oxygen Induction Type: IV induction Ventilation: Mask ventilation without difficulty LMA: LMA inserted LMA Size: 5.0 Number of attempts: 1 Airway Equipment and Method: Bite block Placement Confirmation: positive ETCO2 and breath sounds checked- equal and bilateral Tube secured with: Tape Dental Injury: Teeth and Oropharynx as per pre-operative assessment

## 2021-10-27 NOTE — Op Note (Signed)
10/27/2021  8:52 AM  PATIENT:  Bradley [name]    PRE-OPERATIVE DIAGNOSIS:  Open traumatic left knee dislocation with large anterior soft tissue defect  POST-OPERATIVE DIAGNOSIS:  Same  PROCEDURE:  1.Irrigation debridement left open knee wound 2.  Application of myriad morcells and myriad matrix 3. application of wound VAC  SURGEON:  Damieon Armendariz A Cherylann Hobday, MD  PHYSICIAN ASSISTANT: Izola Price, RNFA, present and scrubbed throughout the case, critical for completion in a timely fashion, and for retraction, instrumentation, and closure.  ANESTHESIA:   General  PREOPERATIVE INDICATIONS:  Bradley [name] is a  41 y.o. male with a diagnosis of Open Left Knee Laceration who failed conservative measures and elected for surgical management.    The risks benefits and alternatives were discussed with the patient preoperatively including but not limited to the risks of infection, bleeding, nerve injury, cardiopulmonary complications, the need for revision surgery, among others, and the patient was willing to proceed.  ESTIMATED BLOOD LOSS: 50cc   OPERATIVE FINDINGS: Clean wound bed with healthy granulation tissue.  Closure of retinacular defects.   OPERATIVE PROCEDURE:  The patient was brought to the operating room.  General anesthesia was induced.   The dressing and wound VAC was removed.  The wound bed appeared to be healing well with good granulation tissue.  No signs of infection or purulence.  No necrotic or devitalized tissue. Ancef was given.  The left lower extremity was prepped and draped in a sterile fashion timeout was called.  Upon exploration of the wound bed there was a 2 cm retinacular defect on the lateral side adjacent to the patella tendon and a 1 cm defect on the medial side.  The wound was thoroughly debrided with from all loose tissue.  Minimal debridement was necessary.  The wound was then thoroughly irrigated with 3 L of normal saline using pulse lavage.  The joint was also  irrigated through the retinacular defects. Dr. Audelia Hives was present at this point who evaluated the wound and felt the wound bed to be healthy and amenable to coverage with a gastrocnemius flap.  1 g of vancomycin powder was placed into the joint and around the wound.  The retinacular defects were closed with 2-0 PDS. Next 1000 mg of myriad morcells was placed over the joint and wound bed. Next given the decreased swelling the proximal and distal 5 cm were closed and reapproximated with 2-0 nylon.  There was no significant tension on the skin.  The remaining defect measured approximately 16 cm x 5 mm across.  A 7 x 10cm Myriad Matrix was placed over the defect covering the patellar tendon proximal tibia and patella.  The remaining defect was then covered with Sorbact and surgical lube.  A black sponge wound VAC dressing was then placed in the remaining open wound.  The skin edges were reapproximated over the wound VAC with 2-0 nylon.  The wound VAC was connected and had a good seal.  It was set to 125 mmHg continuous suction.    The external fixator was cleaned.  A new sterile dressing was applied with Xeroform, Kerlix, and the leg was wrapped with an Ace wrap.  The patient was awoken from general anesthesia and taken to the PACU in stable condition.   Debridement type: Excisional Debridement   Side: left   Body Location: knee   Tools used for debridement:  scissors, cobb, currette, and rongeur   Pre-debridement Wound size (cm):   Length: 26  Width: 6     Depth: 2    Post-debridement Wound size (cm):   Length: 16        Width: 4    Depth: 1   Debridement depth beyond dead/damaged tissue down to healthy viable tissue: yes   Tissue layer involved: skin, subcutaneous tissue, muscle   Nature of tissue removed: Devitalized Tissue   Irrigation volume: 3 L      Irrigation fluid type: Normal Saline   Post op recs: WB: Nonweightbearing left lower extremity Abx: Continue  broad-spectrum antibiotics Dressing: keep wound VAC intact until return to the OR likely next week. DVT prophylaxis: continue lovenox Dispo: Discussion with Dr. Marla Roe in the OR.  They will see if there is time next week to proceed with a gastrocnemius flap for coverage of the remaining soft tissue defect possibly next Tuesday   Charlies Constable, MD Orthopaedic Surgery

## 2021-10-27 NOTE — Progress Notes (Signed)
Pharmacy Antibiotic Note  Bradley [name] is a 41 y.o. male admitted on 10/06/2021 with L knee open dislocation now with concern for infection. Ortho performed I&D on 9/9 and noted superficial purulent material. WBC WNL and afebrile. Pharmacy has been consulted for Daptomycin dosing.  Plan: Daptomycin 1000mg  IV q24h for 6 weeks Cipro 750mg  PO BID per ID for 6 weeks Weekly CK  F/u renal function, clinical status and length of therapy  Height: 6\' 4"  (193 cm) Weight: (!) 186 kg (410 lb) IBW/kg (Calculated) : 86.8  Temp (24hrs), Avg:98.1 F (36.7 C), Min:97.7 F (36.5 C), Max:98.4 F (36.9 C)  Recent Labs  Lab 10/22/21 0257 10/23/21 0301 10/25/21 0303 10/25/21 1749 10/26/21 0358 10/26/21 1840 10/27/21 0351  WBC 10.6*   < > 9.8 8.9 7.2 8.5 7.2  CREATININE 1.05  --  1.02  --   --   --   --    < > = values in this interval not displayed.     Estimated Creatinine Clearance: 174 mL/min (by C-G formula based on SCr of 1.02 mg/dL).    Allergies  Allergen Reactions   Dilaudid [Hydromorphone] Hives    Antimicrobials this admission: Cefazolin 9/9 x 1 Daptomycin 9/9>> Zosyn 9/9 Cipro 9/11 >> CTX 9/9>>9/11  Dose adjustments this admission:  Microbiology results: 8/27 MRSA PCR: negative 9/9 Wound tissueL Enterococcus, Achromobacter, Bascillus species  [name] A. 11/11, PharmD, BCPS, FNKF Clinical Pharmacist [place] Please utilize Amion for appropriate phone number to reach the unit pharmacist [facility])  10/27/2021 10:16 AM

## 2021-10-27 NOTE — Transfer of Care (Signed)
Immediate Anesthesia Transfer of Care Note  Patient: Bradley [name]  Procedure(s) Performed: IRRIGATION AND DEBRIDEMENT KNEE (Left: Knee)  Patient Location: PACU  Anesthesia Type:General  Level of Consciousness: drowsy  Airway & Oxygen Therapy: Patient Spontanous Breathing and Patient connected to face mask oxygen  Post-op Assessment: Report given to RN and Post -op Vital signs reviewed and stable  Post vital signs: Reviewed and stable  Last Vitals:  Vitals Value Taken Time  BP 134/67 10/27/21 0903  Temp    Pulse 107 10/27/21 0905  Resp 25 10/27/21 0905  SpO2 100 % 10/27/21 0905  Vitals shown include unvalidated device data.  Last Pain:  Vitals:   10/27/21 0539  TempSrc: Oral  PainSc:       Patients Stated Pain Goal: 2 (10/26/21 1711)  Complications: No notable events documented.

## 2021-10-27 NOTE — Progress Notes (Signed)
PT Cancellation Note  Patient Details Name: Bradley [name] MRN: [id number] DOB: Apr 30, 1980   Cancelled Treatment:    Reason Eval/Treat Not Completed: Other (comment);Patient declined, no reason specified (Pt politely declines stating he is too tired after surgery.)  Tana Coast, PT   Tana Coast 10/27/2021, 1:58 PM

## 2021-10-27 NOTE — Interval H&P Note (Signed)
The patient has been re-examined, and the chart reviewed, and there have been no interval changes to the documented history and physical.   Pain well controlled this morning.  Neurovascular intact distally.  Plan for repeat irrigation debridement of the left leg.  Given large soft tissue defect myself and my team have been in discussion with Dr. Lajoyce Corners and Dr. Ulice Bold for discussion of wound wound coverage options.  We will see today if defect is amenable for a possible gastroc flap  The operative side was examined and the patient was confirmed to have sensation to DPN, SPN, TN intact, Motor EHL, ext, flex 5/5, and DP 2+, PT 2+, No significant edema.   The risks, benefits, and alternatives have been discussed at length with patient, and the patient is willing to proceed.  Left leg marked. Consent has been signed.

## 2021-10-28 NOTE — Progress Notes (Signed)
Orthopaedic Trauma Progress Note  SUBJECTIVE: Doing okay today.  Pain controlled.  No specific issues of note.  No chest pain. No SOB. No nausea/vomiting.  OBJECTIVE:  Vitals:   10/28/21 0356 10/28/21 0916  BP: 127/63 129/65  Pulse: 78 73  Resp: 17 18  Temp: 98.2 F (36.8 C) 98 F (36.7 C)  SpO2: 100% 100%    General: Sitting up in bed, no acute distress Respiratory: No increased work of breathing.  Left lower extremity: Ex-Fix in place.  Dressing clean, dry, intact.  Ankle dorsiflexion plantarflexion intact.  Endorses sensation to light touch over all aspects of the foot.  + DP pulse  IMAGING: Stable post op imaging.   LABS:  Results for orders placed or performed during the hospital encounter of 10/06/21 (from the past 24 hour(s))  CBC with Differential/Platelet     Status: Abnormal   Collection Time: 10/27/21  6:10 PM  Result Value Ref Range   WBC 7.7 4.0 - 10.5 K/uL   RBC 3.15 (L) 4.22 - 5.81 MIL/uL   Hemoglobin 8.8 (L) 13.0 - 17.0 g/dL   HCT 27.7 (L) 39.0 - 52.0 %   MCV 87.9 80.0 - 100.0 fL   MCH 27.9 26.0 - 34.0 pg   MCHC 31.8 30.0 - 36.0 g/dL   RDW 14.8 11.5 - 15.5 %   Platelets 466 (H) 150 - 400 K/uL   nRBC 0.0 0.0 - 0.2 %   Neutrophils Relative % 73 %   Neutro Abs 5.6 1.7 - 7.7 K/uL   Lymphocytes Relative 21 %   Lymphs Abs 1.6 0.7 - 4.0 K/uL   Monocytes Relative 5 %   Monocytes Absolute 0.4 0.1 - 1.0 K/uL   Eosinophils Relative 0 %   Eosinophils Absolute 0.0 0.0 - 0.5 K/uL   Basophils Relative 0 %   Basophils Absolute 0.0 0.0 - 0.1 K/uL   Immature Granulocytes 1 %   Abs Immature Granulocytes 0.04 0.00 - 0.07 K/uL  CK     Status: None   Collection Time: 10/28/21  2:49 AM  Result Value Ref Range   Total CK 53 49 - 397 U/L  CBC with Differential/Platelet     Status: Abnormal   Collection Time: 10/28/21  2:49 AM  Result Value Ref Range   WBC 9.2 4.0 - 10.5 K/uL   RBC 3.00 (L) 4.22 - 5.81 MIL/uL   Hemoglobin 8.3 (L) 13.0 - 17.0 g/dL   HCT 26.7 (L) 39.0 -  52.0 %   MCV 89.0 80.0 - 100.0 fL   MCH 27.7 26.0 - 34.0 pg   MCHC 31.1 30.0 - 36.0 g/dL   RDW 15.0 11.5 - 15.5 %   Platelets 407 (H) 150 - 400 K/uL   nRBC 0.0 0.0 - 0.2 %   Neutrophils Relative % 56 %   Neutro Abs 5.3 1.7 - 7.7 K/uL   Lymphocytes Relative 32 %   Lymphs Abs 2.9 0.7 - 4.0 K/uL   Monocytes Relative 8 %   Monocytes Absolute 0.7 0.1 - 1.0 K/uL   Eosinophils Relative 2 %   Eosinophils Absolute 0.2 0.0 - 0.5 K/uL   Basophils Relative 1 %   Basophils Absolute 0.1 0.0 - 0.1 K/uL   Immature Granulocytes 1 %   Abs Immature Granulocytes 0.08 (H) 0.00 - 0.07 K/uL    ASSESSMENT: Bradley [name] is a 41 y.o. male, 1 Day Post-Op   S/P LEFT KNEE REDUCTION AND PLACEMENT OF EXTERNAL FIXATOR  By Dr. Percell Miller on 10/06/21  S/P LEFT KNEE REDUCTION AND PLACEMENT OF ADDITIONAL PINS IN EXTERNAL FIXATOR  By Dr. Percell Miller on 10/07/21   S/P IRRIGATION AND DEBRIDEMENT LEFT KNEE by Dr. Griffin Basil on 10/21/21   S/P IRRIGATION AND DEBRIDEMENT LEFT KNEE  by Dr. Percell Miller on 10/24/21  S/P IRRIGATION AND DEBRIDEMENT LEFT KNEE WITH APPLICATION OF [facility] [place]  By DR. Marchwiany 10/27/21  CV/Blood loss: Acute blood loss anemia, Hgb 8.3 this AM. Hemodynamically stable  PLAN: Weightbearing: NWB LLE ROM: OK for ankle and foot motion as tolerated. Wiggle toes frequently  Incisional and dressing care: Reinforce dressings as needed  Showering: Keep dressings dry Orthopedic device(s):  Ex fix   Pain management: Continue current regimen VTE prophylaxis: Lovenox, SCDs ID: Per ID via PICC line Foley/Lines:  No foley, KVO IVFs Dispo: Continue inpatient care.  Case was discussed with Dr. Marla Roe in the OR.  They will see if there is time next week to proceed with a gastrocnemius flap for coverage of the remaining soft tissue defect possibly next Tuesday   D/C recommendations: -Switch to Xarelto x30 days for DVT prophylaxis   Follow - up plan:  To be determined pending plastics  input   Contact information:  Katha Hamming MD, Rushie Nyhan PA-C. After hours and holidays please check [email or web address] for group call information for [facility]   Gwinda Passe, PA-C [phone] (office) [email or web address]
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
## 2021-10-30 NOTE — Op Note (Signed)
10/24/2021  8:25 AM  PATIENT:  Bradley [name]    PRE-OPERATIVE DIAGNOSIS:  Left knee wound  POST-OPERATIVE DIAGNOSIS:  Same  PROCEDURE:  IRRIGATION AND DEBRIDEMENT KNEE  SURGEON:  Renette Butters, MD  ASSISTANT: Aggie Moats, PA-C, he was present and scrubbed throughout the case, critical for completion in a timely fashion, and for retraction, instrumentation, and closure.   ANESTHESIA:   gen  PREOPERATIVE INDICATIONS:  [name] is a  41 y.o. male with a diagnosis of Left knee wound who failed conservative measures and elected for surgical management.    The risks benefits and alternatives were discussed with the patient preoperatively including but not limited to the risks of infection, bleeding, nerve injury, cardiopulmonary complications, the need for revision surgery, among others, and the patient was willing to proceed.  OPERATIVE IMPLANTS: wound vac  OPERATIVE FINDINGS: no purulence  BLOOD LOSS: 20  COMPLICATIONS: none  TOURNIQUET TIME: none  OPERATIVE PROCEDURE:  Patient was identified in the preoperative holding area and site was marked by me He was transported to the operating theater and placed on the table in supine position taking care to pad all bony prominences. After a preincinduction time out anesthesia was induced. The left lower extremity was prepped and draped in normal sterile fashion and a pre-incision timeout was performed. He received ancef for preoperative antibiotics.   Removed his wound VAC leg was prepped with Betadine scrub and paint.  I performed a debridement of any none vitalized tissue down to the level of muscle.  I performed an arthrotomy at the lateral inferior knee joint surface cystoscopy tubing irrigated this with 9 L of saline.  I then placed Vanco powder in his knee joint  I irrigated superficially placed bank powder here as well I did close his inferior arthrotomy with a nylon stitch replaced a wound VAC placing retention  stitches around this.  Sterile dressing was applied he was taken to the PACU in stable condition  POST OPERATIVE PLAN: Nonweightbearing chemical DVT prophylaxis return for repeat debridement later this week
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
## 2021-10-31 DIAGNOSIS — S83105D Unspecified dislocation of left knee, subsequent encounter: Secondary | ICD-10-CM | POA: Diagnosis not present

## 2021-10-31 NOTE — Progress Notes (Signed)
Inpatient Rehab Admissions Coordinator:   Continue to follow.  Will need to re-do insurance auth as it expires today.  Will wait till closer to medically ready to re-open.    Shann Medal, PT, DPT Admissions Coordinator [phone] 10/31/21  11:04 AM
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
## 2021-10-31 NOTE — Progress Notes (Signed)
Subjective: Patient reports pain as moderate. Chest continues to feel much better since using lidocaine patches. Tolerating diet. Urinating. No SOB. Continues to work with PT on mobilizing OOB and is making good progress. Very motivated.   Cultures grew Enterococcus faecalis and Achromobacter denitrificans. On IV ABX via PICC line. Hopefully had the last knee I&D procedure last Friday 9/15. Wound vac is in place. Plastics to see patient to discuss wound closure with flap.   Objective:   VITALS:   Vitals:   10/30/21 0757 10/30/21 1532 10/30/21 1932 10/31/21 0413  BP: (!) 124/54 (!) 149/92 (!) 124/57 (!) 141/59  Pulse: 77 87 74 79  Resp: 18 18 17 18   Temp: (!) 97.4 F (36.3 C) 98.1 F (36.7 C) 98.5 F (36.9 C) 98.5 F (36.9 C)  TempSrc:      SpO2: 100% 97% 100% 100%  Weight:      Height:          Latest Ref Rng & Units 10/31/2021    2:09 AM 10/30/2021    5:38 PM 10/30/2021    3:11 AM  CBC  WBC 4.0 - 10.5 K/uL 6.2  6.5  6.3   Hemoglobin 13.0 - 17.0 g/dL 8.6  8.8  8.4   Hematocrit 39.0 - 52.0 % 28.5  29.3  28.3   Platelets 150 - 400 K/uL 330  357  351       Latest Ref Rng & Units 10/25/2021    3:03 AM 10/22/2021    2:57 AM 10/15/2021   12:42 AM  BMP  Glucose 70 - 99 mg/dL 12/15/2021  751  700   BUN 6 - 20 mg/dL 17  20  16    Creatinine 0.61 - 1.24 mg/dL 174   9.44   Sodium 135 - 145 mmol/L 135  136  137   Potassium 3.5 - 5.1 mmol/L 4.4  3.8  4.2   Chloride 98 - 111 mmol/L 104  105  106   CO2 22 - 32 mmol/L 25  24  24    Calcium 8.9 - 10.3 mg/dL 9.0  8.7  8.4    Intake/Output      09/18 0701 09/19 0700 09/19 0701 09/20 0700   P.O. 720    I.V. (mL/kg) 10 (0.1)    IV Piggyback     Total Intake(mL/kg) 730 (3.9)    Urine (mL/kg/hr) 1300 (0.3)    Total Output 1300    Net -570            Physical Exam: General: NAD.  Laying in bed, calm, comfortable  Resp: No increased wob Cardio: regular rate and rhythm ABD soft Neurologically intact MSK Neurovascularly  intact Sensation intact distally Intact pulses distally Dorsiflexion/Plantar flexion intact  Calf soft and compressible  Incision: dressing C/D/I Ex fix in place Cast to LUE intact, can move fingers Wound vac running with 250 cc serosanguinous fluid in canister       Assessment: S/P LEFT KNEE REDUCTION AND PLACEMENT OF EXTERNAL FIXATOR  By Dr. 10/19 on 10/06/21  S/P LEFT KNEE REDUCTION AND PLACEMENT OF ADDITIONAL PINS IN EXTERNAL FIXATOR  By Dr. 10/20 on 10/07/21  S/P Procedure(s) (LRB): IRRIGATION AND DEBRIDEMENT KNEE (Left) by Dr. 10/08/21 on 10/21/21  S/P Procedure(s) (LRB): IRRIGATION AND DEBRIDEMENT KNEE (Left) by Dr. 10/09/21 on 10/24/21  S/P Procedure(s) (LRB): IRRIGATION AND DEBRIDEMENT KNEE (Left) by Dr. 12/21/21 on 10/27/21   Principal Problem:   Left knee dislocation Active Problems:   Wound infection after  surgery, left knee    Plan: Plastics team planning for surgery this Thursday 9/21. Gastrocnemius flap mentioned. Left knee xray updated today. Joint remains in same alignment as previous imaging.    Advance diet Up with therapy as able while maintaining WB status Incentive Spirometry Elevate and Apply ice H/H 8.6 today. Low but has been stable. Has Iron supplement  Continue lidocaine patches for sternum pain   Weightbearing: NWB LUE and LLE, able to use platform walker though Insicional and dressing care: Reinforce PRN Orthopedic device(s):  Ex fix likely for 6 weeks (which will be ~Oct 6th) Showering: Keep dressing dry VTE prophylaxis: Lovenox 100mg  daily while inpatient, can switch to Xarelto x 30 days upon d/c , SCDs, ambulation Pain control: Tylenol, Tramadol, Oxycodone, Morphine, lidocaine patches PRN ABX: per ID via PICC line for at least as long as the ex-fix is in place, once removed then will discuss stopping ABX   Dispo:  Wound closure via plastics. Ex-fix has been in place for almost 4 weeks so need 2 more weeks before removal. CIR  once cleared.     Britt Bottom, PA-C Office [phone] 10/31/2021, 7:46 AM
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
## 2021-11-01 DIAGNOSIS — S83105D Unspecified dislocation of left knee, subsequent encounter: Secondary | ICD-10-CM | POA: Diagnosis not present

## 2021-11-01 NOTE — Progress Notes (Signed)
PHARMACY CONSULT NOTE FOR:  OUTPATIENT  PARENTERAL ANTIBIOTIC THERAPY (OPAT)  Indication: Left knee septic arthritis  Regimen: Daptomycin 1000 mg every 24 hours + Ciprofloxacin 750 mg BID  End date: 12/29/21   IV antibiotic discharge orders are pended. To discharging provider:  please sign these orders via discharge navigator,  Select New Orders & click on the button choice - Manage This Unsigned Work.     Thank you for allowing pharmacy to be a part of this patient's care.  Jimmy Footman, PharmD, BCPS, BCIDP Infectious Diseases Clinical Pharmacist Phone: [phone] 11/01/2021, 1:25 PM
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
## 2021-11-01 NOTE — Progress Notes (Signed)
Inpatient Rehab Admissions Coordinator:   Continue to follow.  Appears next surgery tomorrow with Dr. Marla Roe to close wounds, and ex fix to remain in place for at least 2 weeks.  If therapy can see pt post op we can restart insurance auth with potential plan for CIR prior to removal of ex-fix.  Will depending on prior auth timing and bed availability, as well as need for any more surgery prior to ex fix removal.    Shann Medal, PT, DPT Admissions Coordinator [phone] 11/01/21  3:15 PM
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
## 2021-11-01 NOTE — Progress Notes (Signed)
Subjective: Had an episode of left knee popping/shifting while transferring with PT/OT. Immediate severe pain that resolved with rest. Worried his knee dislocated again.   Cultures grew Enterococcus faecalis and Achromobacter denitrificans. On IV ABX via PICC line. Hopefully had the last knee I&D procedure last Friday 9/15. Wound vac is in place. Plastics saw patient to discuss wound closure with flap. Plan for surgery tomorrow.  Objective:   VITALS:   Vitals:   11/01/21 0500 11/01/21 0545 11/01/21 0814 11/01/21 1415  BP:  135/71 134/79 130/64  Pulse:  74 79 80  Resp:  18 17 17   Temp:  97.9 F (36.6 C) 97.6 F (36.4 C) 97.6 F (36.4 C)  TempSrc:  Oral Oral Oral  SpO2:  100% 99% 98%  Weight: (!) 220.6 kg     Height:          Latest Ref Rng & Units 10/31/2021    2:09 AM 10/30/2021    5:38 PM 10/30/2021    3:11 AM  CBC  WBC 4.0 - 10.5 K/uL 6.2  6.5  6.3   Hemoglobin 13.0 - 17.0 g/dL 8.6  8.8  8.4   Hematocrit 39.0 - 52.0 % 28.5  29.3  28.3   Platelets 150 - 400 K/uL 330  357  351       Latest Ref Rng & Units 10/25/2021    3:03 AM 10/22/2021    2:57 AM 10/15/2021   12:42 AM  BMP  Glucose 70 - 99 mg/dL 115  108  110   BUN 6 - 20 mg/dL 17  20  16    Creatinine 0.61 - 1.24 mg/dL 1.02  1.05  1.02   Sodium 135 - 145 mmol/L 135  136  137   Potassium 3.5 - 5.1 mmol/L 4.4  3.8  4.2   Chloride 98 - 111 mmol/L 104  105  106   CO2 22 - 32 mmol/L 25  24  24    Calcium 8.9 - 10.3 mg/dL 9.0  8.7  8.4    Intake/Output      09/19 0701 09/20 0700 09/20 0701 09/21 0700   P.O.  480   I.V. (mL/kg) 10 (0)    Total Intake(mL/kg) 10 (0) 480 (2.2)   Urine (mL/kg/hr) 550 (0.1) 500 (0.2)   Total Output 550 500   Net -540 -20             Assessment: S/P LEFT KNEE REDUCTION AND PLACEMENT OF EXTERNAL FIXATOR  By Dr. Percell Miller on 10/06/21  S/P LEFT KNEE REDUCTION AND PLACEMENT OF ADDITIONAL PINS IN EXTERNAL FIXATOR  By Dr. Percell Miller on 10/07/21  S/P Procedure(s) (LRB): IRRIGATION AND  DEBRIDEMENT KNEE (Left) by Dr. Griffin Basil on 10/21/21  S/P Procedure(s) (LRB): IRRIGATION AND DEBRIDEMENT KNEE (Left) by Dr. Percell Miller on 10/24/21  S/P Procedure(s) (LRB): IRRIGATION AND DEBRIDEMENT KNEE (Left) by Dr. Zachery Dakins on 10/27/21   Principal Problem:   Left knee dislocation Active Problems:   Wound infection after surgery, left knee    Plan: Plastics team planning for surgery tomorrow. Will make him NPO at midnight.  Even though we just got an updated left knee xray yesterday, due to this event of shifting and concern for alignment today I will order an additional xray to be done.    Advance diet Up with therapy as able while maintaining WB status Incentive Spirometry Elevate and Apply ice H/H 8.6 yesterday. Low but has been stable. Continue to monitor. Has Iron supplement  Continue lidocaine patches for  sternum pain   Weightbearing: NWB LUE and LLE, able to use platform walker though Insicional and dressing care: Reinforce PRN Orthopedic device(s):  Ex fix likely for 6 weeks (which will be ~Oct 6th) Showering: Keep dressing dry VTE prophylaxis: Lovenox 100mg  daily while inpatient, can switch to Xarelto x 30 days upon d/c , SCDs, ambulation Pain control: Tylenol, Tramadol, Oxycodone, Morphine, lidocaine patches PRN ABX: per ID via PICC line for at least as long as the ex-fix is in place, once removed then will discuss stopping ABX   Dispo:  Wound closure via plastics. Ex-fix has been in place for almost 4 weeks so need 2 more weeks before removal. CIR once cleared.     , PA-C Office [phone] 11/01/2021, 5:38 PM

## 2021-11-01 NOTE — Plan of Care (Addendum)
~  1145 while transferring from chair to a standing position he stated he felt as if his bones shifted in his knee and had intense pain to the knee but once he sat back down and elevated it he stated he was a 0 out of 10 pain. Meghan Gawne aware of situation, will continue to monitor.   Problem: Education: Goal: Knowledge of General Education information will improve Description: Including pain rating scale, medication(s)/side effects and non-pharmacologic comfort measures Outcome: Progressing   Problem: Activity: Goal: Risk for activity intolerance will decrease Outcome: Progressing   Problem: Pain Managment: Goal: General experience of comfort will improve Outcome: Progressing   Problem: Safety: Goal: Ability to remain free from injury will improve Outcome: Progressing   Problem: Skin Integrity: Goal: Risk for impaired skin integrity will decrease Outcome: Progressing
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
## 2021-11-02 DIAGNOSIS — S85002A Unspecified injury of popliteal artery, left leg, initial encounter: Secondary | ICD-10-CM

## 2021-11-02 HISTORY — PX: INCISION AND DRAINAGE OF WOUND: SHX1803

## 2021-11-02 NOTE — Anesthesia Postprocedure Evaluation (Signed)
Anesthesia Post Note  Patient: Korin Hartwell  Procedure(s) Performed: Left leg/knee debridement with Myriad and VAC change (Left: Knee)     Patient location during evaluation: PACU Anesthesia Type: General Level of consciousness: awake and alert Pain management: pain level controlled Vital Signs Assessment: post-procedure vital signs reviewed and stable Respiratory status: spontaneous breathing, nonlabored ventilation and respiratory function stable Cardiovascular status: blood pressure returned to baseline and stable Postop Assessment: no apparent nausea or vomiting Anesthetic complications: no   No notable events documented.  Last Vitals:  Vitals:   11/02/21 1330 11/02/21 1345  BP: 107/60 110/71  Pulse: 78 74  Resp: 19 15  Temp:  37.1 C  SpO2: 100% 100%    Last Pain:  Vitals:   11/02/21 1345  TempSrc:   PainSc: 0-No pain                 Lynda Rainwater

## 2021-11-02 NOTE — Progress Notes (Signed)
PT Cancellation Note  Patient Details Name: Bradley [name] MRN: [id number] DOB: September 06, 1980   Cancelled Treatment:    Reason Eval/Treat Not Completed: (P) Patient at procedure or test/unavailable Pt is off floor for sx. PT will follow back this afternoon as able.  Reniyah Gootee B. Migdalia Dk PT, DPT Acute Rehabilitation Services Please use secure chat or  Call Office [phone]    [place] 11/02/2021, 10:50 AM

## 2021-11-02 NOTE — Transfer of Care (Signed)
Immediate Anesthesia Transfer of Care Note  Patient: Bradley [name]  Procedure(s) Performed: Left leg/knee debridement with Myriad and VAC change (Left: Knee)  Patient Location: PACU  Anesthesia Type:General  Level of Consciousness: awake, oriented and drowsy  Airway & Oxygen Therapy: Patient Spontanous Breathing and Patient connected to nasal cannula oxygen  Post-op Assessment: Report given to RN and Post -op Vital signs reviewed and stable  Post vital signs: Reviewed and stable  Last Vitals:  Vitals Value Taken Time  BP 136/79 11/02/21 1258  Temp    Pulse 91 11/02/21 1300  Resp 26 11/02/21 1300  SpO2 99 % 11/02/21 1300  Vitals shown include unvalidated device data.  Last Pain:  Vitals:   11/02/21 1003  TempSrc:   PainSc: 0-No pain      Patients Stated Pain Goal: 3 (16/10/96 0454)  Complications: No notable events documented.

## 2021-11-02 NOTE — Anesthesia Procedure Notes (Signed)
Procedure Name: LMA Insertion Date/Time: 11/02/2021 11:45 AM  Performed by: Anastasio Auerbach, CRNAPre-anesthesia Checklist: Patient identified, Emergency Drugs available, Suction available and Patient being monitored Patient Re-evaluated:Patient Re-evaluated prior to induction Oxygen Delivery Method: Circle system utilized Preoxygenation: Pre-oxygenation with 100% oxygen Induction Type: IV induction Ventilation: Mask ventilation without difficulty LMA: LMA inserted and LMA with gastric port inserted LMA Size: 5.0 Laser Tube: Cuffed inflated with minimal occlusive pressure - saline Placement Confirmation: breath sounds checked- equal and bilateral and positive ETCO2 Tube secured with: Tape

## 2021-11-02 NOTE — Progress Notes (Signed)
OPAT ORDERS:  Diagnosis: Left knee septic arthritis, LLE wound with ex-fix  Culture Result:  faecalis, bacillus species,  achromobacter denitrificans  Allergies  Allergen Reactions   Dilaudid [Hydromorphone] Hives     Discharge antibiotics to be given via PICC line:  Per pharmacy protocol daptomycin 100 mg q24h  and ciprofloxacin $RemoveBeforeDEI'750mg'[id number]$  PO bid   End Date: 11/17/21  [facility] Care Per Protocol with Biopatch Use: Home health RN for IV administration and teaching, line care and labs.    Labs weekly while on IV antibiotics: __ CBC with differential __ CMP __ CRP __ ESR __ CK   __ Please leave PIC in place until doctor has seen patient or been notified  Fax weekly labs to [phone]  Clinic Follow Up Appt: 10/6  @ [facility] with Dr. Candiss Norse

## 2021-11-02 NOTE — Interval H&P Note (Signed)
History and Physical Interval Note:  11/02/2021 11:09 AM  Bradley [name]  has presented today for surgery, with the diagnosis of Left knee wound.  The various methods of treatment have been discussed with the patient and family. After consideration of risks, benefits and other options for treatment, the patient has consented to  Procedure(s) with comments: Left leg/knee debridement with Myriad and VAC change (Left) - requesting 30 mins as a surgical intervention.  The patient's history has been reviewed, patient examined, no change in status, stable for surgery.  I have reviewed the patient's chart and labs.  Questions were answered to the patient's satisfaction.     Loel Lofty Lucyann Romano
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
## 2021-11-02 NOTE — Anesthesia Preprocedure Evaluation (Addendum)
Anesthesia Evaluation  Patient identified by MRN, date of birth, ID band Patient awake    Reviewed: Allergy & Precautions, H&P , NPO status , Patient's Chart, lab work & pertinent test results  Airway Mallampati: II   Neck ROM: full    Dental   Pulmonary neg pulmonary ROS,    breath sounds clear to auscultation       Cardiovascular negative cardio ROS   Rhythm:regular Rate:Normal     Neuro/Psych    GI/Hepatic   Endo/Other  Morbid obesity  Renal/GU      Musculoskeletal   Abdominal (+) + obese,   Peds  Hematology  (+) Blood dyscrasia, anemia ,   Anesthesia Other Findings   Reproductive/Obstetrics                             Anesthesia Physical  Anesthesia Plan  ASA: 2  Anesthesia Plan: General   Post-op Pain Management: Minimal or no pain anticipated   Induction: Intravenous  PONV Risk Score and Plan: 2 and Ondansetron, Midazolam and Treatment may vary due to age or medical condition  Airway Management Planned: LMA  Additional Equipment:   Intra-op Plan:   Post-operative Plan: Extubation in OR  Informed Consent: I have reviewed the patients History and Physical, chart, labs and discussed the procedure including the risks, benefits and alternatives for the proposed anesthesia with the patient or authorized representative who has indicated his/her understanding and acceptance.     Dental advisory given  Plan Discussed with: CRNA, Anesthesiologist and Surgeon  Anesthesia Plan Comments:         Anesthesia Quick Evaluation

## 2021-11-02 NOTE — Plan of Care (Signed)
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
## 2021-11-03 DIAGNOSIS — S83105A Unspecified dislocation of left knee, initial encounter: Secondary | ICD-10-CM

## 2021-11-03 HISTORY — PX: ABDOMINAL AORTOGRAM W/LOWER EXTREMITY: CATH118223
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
## 2021-11-03 NOTE — Progress Notes (Signed)
Inpatient Rehab Admissions Coordinator:   Pt to cath lab with [facility] today.  Potentially more intervention pending.  My colleague, Robert Bellow, will follow up Monday.   Shann Medal, PT, DPT Admissions Coordinator [phone] 11/03/21  11:31 AM
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
## 2021-11-03 NOTE — Progress Notes (Signed)
PT Cancellation Note  Patient Details Name: Bradley [name] MRN: [id number] DOB: 05-13-1980   Cancelled Treatment:    Reason Eval/Treat Not Completed: Active bedrest order Pt to OR earlier and now bedrest 2 hours.  Will f/u later date. [name], PT Acute Rehab [facility] [phone]   [name] 11/03/2021, 4:30 PM

## 2021-11-03 NOTE — Progress Notes (Signed)
PT Cancellation Note  Patient Details Name: Bradley [name] MRN: [id number] DOB: [birth year]   Cancelled Treatment:    Reason Eval/Treat Not Completed: Patient at procedure or test/unavailable  Pt just left for procedure.  Will f/u as able. [name], PT Acute Rehab [facility] Re[facility] [phone]  [name] 11/03/2021, 10:30 AM

## 2021-11-04 DIAGNOSIS — S83105A Unspecified dislocation of left knee, initial encounter: Secondary | ICD-10-CM | POA: Diagnosis not present
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
## 2021-11-04 NOTE — Progress Notes (Addendum)
Orthopaedic Trauma Service Progress Note  Patient ID: Bradley [name] MRN: [id number] DOB/AGE: Jun 19, 1980 41 y.o.  Subjective:  No complaints  Angiogram yesterday for L leg with vascular was unremarkable.  No concerning findings regarding flow noted   Remains on cipro and daptomycin per ID service   ROS As above  Objective:   VITALS:   Vitals:   11/03/21 1505 11/03/21 1549 11/03/21 2149 11/04/21 0538  BP: (!) 142/64 132/68 (!) 141/74 (!) 149/77  Pulse: 79 77 87 71  Resp: (!) 22  20 20   Temp:  97.9 F (36.6 C) 98 F (36.7 C) 97.8 F (36.6 C)  TempSrc:  Oral Oral Oral  SpO2: 99% 100% 98% 100%  Weight:      Height:        Estimated body mass index is 50.88 kg/m as calculated from the following:   Height as of this encounter: 6\' 4"  (1.93 m).   Weight as of this encounter: 189.6 kg.   Intake/Output      09/22 0701 09/23 0700 09/23 0701 09/24 0700   I.V. (mL/kg)  1374 (7.2)   IV Piggyback  138.4   Total Intake(mL/kg)  1512.5 (8)   Urine (mL/kg/hr) 1650 (0.4) 600 (0.8)   Blood     Total Output 1650 600   Net -1650 +912.5        Urine Occurrence 753 x      LABS  Results for orders placed or performed during the hospital encounter of 10/06/21 (from the past 24 hour(s))  CBC with Differential/Platelet     Status: Abnormal   Collection Time: 11/03/21  5:40 PM  Result Value Ref Range   WBC 6.1 4.0 - 10.5 K/uL   RBC 3.21 (L) 4.22 - 5.81 MIL/uL   Hemoglobin 8.8 (L) 13.0 - 17.0 g/dL   HCT 28.3 (L) 39.0 - 52.0 %   MCV 88.2 80.0 - 100.0 fL   MCH 27.4 26.0 - 34.0 pg   MCHC 31.1 30.0 - 36.0 g/dL   RDW 14.9 11.5 - 15.5 %   Platelets 313 150 - 400 K/uL   nRBC 0.0 0.0 - 0.2 %   Neutrophils Relative % 49 %   Neutro Abs 3.1 1.7 - 7.7 K/uL   Lymphocytes Relative 39 %   Lymphs Abs 2.4 0.7 - 4.0 K/uL   Monocytes Relative 8 %   Monocytes Absolute 0.5 0.1 - 1.0 K/uL   Eosinophils Relative 2 %    Eosinophils Absolute 0.1 0.0 - 0.5 K/uL   Basophils Relative 1 %   Basophils Absolute 0.0 0.0 - 0.1 K/uL   Immature Granulocytes 1 %   Abs Immature Granulocytes 0.03 0.00 - 0.07 K/uL  Basic metabolic panel     Status: Abnormal   Collection Time: 11/03/21  5:40 PM  Result Value Ref Range   Sodium 138 135 - 145 mmol/L   Potassium 3.6 3.5 - 5.1 mmol/L   Chloride 105 98 - 111 mmol/L   CO2 26 22 - 32 mmol/L   Glucose, Bld 103 (H) 70 - 99 mg/dL   BUN 14 6 - 20 mg/dL   Creatinine, Ser 0.99 0.61 - 1.24 mg/dL   Calcium 8.7 (L) 8.9 - 10.3 mg/dL   GFR, Estimated >60 >60 mL/min   Anion gap 7 5 - 15  CBC  with Differential/Platelet     Status: Abnormal   Collection Time: 11/04/21  6:30 AM  Result Value Ref Range   WBC 4.9 4.0 - 10.5 K/uL   RBC 3.11 (L) 4.22 - 5.81 MIL/uL   Hemoglobin 8.4 (L) 13.0 - 17.0 g/dL   HCT 28.1 (L) 39.0 - 52.0 %   MCV 90.4 80.0 - 100.0 fL   MCH 27.0 26.0 - 34.0 pg   MCHC 29.9 (L) 30.0 - 36.0 g/dL   RDW 15.0 11.5 - 15.5 %   Platelets 272 150 - 400 K/uL   nRBC 0.0 0.0 - 0.2 %   Neutrophils Relative % 43 %   Neutro Abs 2.1 1.7 - 7.7 K/uL   Lymphocytes Relative 46 %   Lymphs Abs 2.3 0.7 - 4.0 K/uL   Monocytes Relative 7 %   Monocytes Absolute 0.3 0.1 - 1.0 K/uL   Eosinophils Relative 3 %   Eosinophils Absolute 0.1 0.0 - 0.5 K/uL   Basophils Relative 0 %   Basophils Absolute 0.0 0.0 - 0.1 K/uL   Immature Granulocytes 1 %   Abs Immature Granulocytes 0.03 0.00 - 0.07 K/uL  Lipid panel     Status: Abnormal   Collection Time: 11/04/21  6:30 AM  Result Value Ref Range   Cholesterol 159 0 - 200 mg/dL   Triglycerides 90 <150 mg/dL   HDL 30 (L) >40 mg/dL   Total CHOL/HDL Ratio 5.3 RATIO   VLDL 18 0 - 40 mg/dL   LDL Cholesterol 111 (H) 0 - 99 mg/dL     PHYSICAL EXAM:   Gen: in bed, NAD, pleasant Left Lower Extremity              Ex fix intact             vac functioning and with good seal              Ext warm              + DP pulse             Good  perfusion distally              DPN, SPN, TN sensation symmetric to contra-lateral side             No DCT             Compartments are soft             EHL, FHL, lesser toe motor intact             Ankle flexion, extension, inversion and eversion intact     Assessment/Plan: 1 Day Post-Op   Principal Problem:   Left knee dislocation Active Problems:   Wound infection after surgery, left knee   Anti-infectives (From admission, onward)    Start     Dose/Rate Route Frequency Ordered Stop   10/27/21 1115  ceFAZolin (ANCEF) IVPB 3g/100 mL premix  Status:  Discontinued        3 g 200 mL/hr over 30 Minutes Intravenous On call to O.R. 10/27/21 1017 10/27/21 1039   10/27/21 0725  vancomycin (VANCOCIN) powder  Status:  Discontinued          As needed 10/27/21 0725 10/27/21 0859   10/24/21 2000  DAPTOmycin (CUBICIN) 1,050 mg in sodium chloride 0.9 % IVPB  Status:  Discontinued        8 mg/kg  133.7 kg (Adjusted) 142 mL/hr over 30 Minutes Intravenous Daily 10/21/21  1134 10/21/21 1236   10/24/21 2000  DAPTOmycin (CUBICIN) 1,000 mg in sodium chloride 0.9 % IVPB        1,000 mg 140 mL/hr over 30 Minutes Intravenous Daily 10/21/21 1236     10/24/21 1610  vancomycin (VANCOCIN) powder  Status:  Discontinued          As needed 10/24/21 1620 10/24/21 1704   10/24/21 0600  ceFAZolin (ANCEF) IVPB 3g/100 mL premix        3 g 200 mL/hr over 30 Minutes Intravenous On call to O.R. 10/23/21 1813 10/24/21 1611   10/23/21 1600  DAPTOmycin (CUBICIN) 1,050 mg in sodium chloride 0.9 % IVPB  Status:  Discontinued        8 mg/kg  133.7 kg (Adjusted) 142 mL/hr over 30 Minutes Intravenous Daily 10/21/21 1134 10/21/21 1136   10/23/21 1600  DAPTOmycin (CUBICIN) 1,050 mg in sodium chloride 0.9 % IVPB  Status:  Discontinued        8 mg/kg  133.7 kg (Adjusted) 142 mL/hr over 30 Minutes Intravenous  Once 10/21/21 1136 10/21/21 1236   10/23/21 1600  DAPTOmycin (CUBICIN) 1,000 mg in sodium chloride 0.9 % IVPB         1,000 mg 140 mL/hr over 30 Minutes Intravenous  Once 10/21/21 1236 10/23/21 1759   10/23/21 1015  ciprofloxacin (CIPRO) tablet 750 mg        750 mg Oral 2 times daily 10/23/21 0929     10/22/21 1400  DAPTOmycin (CUBICIN) 1,050 mg in sodium chloride 0.9 % IVPB  Status:  Discontinued        8 mg/kg  133.7 kg (Adjusted) 142 mL/hr over 30 Minutes Intravenous  Once 10/21/21 1134 10/21/21 1236   10/22/21 1400  DAPTOmycin (CUBICIN) 1,000 mg in sodium chloride 0.9 % IVPB        1,000 mg 140 mL/hr over 30 Minutes Intravenous  Once 10/21/21 1236 10/22/21 1608   10/21/21 1900  vancomycin (VANCOREADY) IVPB 1500 mg/300 mL  Status:  Discontinued        1,500 mg 150 mL/hr over 120 Minutes Intravenous Every 8 hours 10/21/21 1026 10/21/21 1125   10/21/21 1400  piperacillin-tazobactam (ZOSYN) IVPB 3.375 g  Status:  Discontinued        3.375 g 12.5 mL/hr over 240 Minutes Intravenous Every 8 hours 10/21/21 1006 10/21/21 1112   10/21/21 1230  DAPTOmycin (CUBICIN) 1,050 mg in sodium chloride 0.9 % IVPB        8 mg/kg  133.7 kg (Adjusted) 142 mL/hr over 30 Minutes Intravenous  Once 10/21/21 1134 10/21/21 1407   10/21/21 1200  cefTRIAXone (ROCEPHIN) 2 g in sodium chloride 0.9 % 100 mL IVPB  Status:  Discontinued        2 g 200 mL/hr over 30 Minutes Intravenous Every 24 hours 10/21/21 1112 10/23/21 0929   10/21/21 1100  vancomycin (VANCOCIN) 2,500 mg in sodium chloride 0.9 % 500 mL IVPB  Status:  Discontinued        2,500 mg 262.5 mL/hr over 120 Minutes Intravenous  Once 10/21/21 1006 10/21/21 1125   10/21/21 0830  tobramycin (NEBCIN) powder  Status:  Discontinued          As needed 10/21/21 0900 10/21/21 0903   10/21/21 0830  vancomycin (VANCOCIN) powder  Status:  Discontinued          As needed 10/21/21 0900 10/21/21 0903   10/21/21 0700  ceFAZolin (ANCEF) 3-0.9 GM/100ML-% IVPB       Note to  Pharmacy: Nyoka Cowden D: cabinet override      10/21/21 0700 10/21/21 0809   10/21/21 0600  ceFAZolin  (ANCEF) IVPB 3g/100 mL premix        3 g 200 mL/hr over 30 Minutes Intravenous On call to O.R. 10/20/21 1653 10/21/21 0803   10/08/21 0823  gentamicin (GARAMYCIN) injection  Status:  Discontinued          As needed 10/08/21 0823 10/08/21 0909   10/08/21 0600  ceFAZolin (ANCEF) IVPB 3g/100 mL premix        3 g 200 mL/hr over 30 Minutes Intravenous To Short Stay 10/08/21 0304 10/08/21 0816   10/06/21 2200  cefTRIAXone (ROCEPHIN) 2 g in sodium chloride 0.9 % 100 mL IVPB        2 g 200 mL/hr over 30 Minutes Intravenous Every 24 hours 10/06/21 2013 10/08/21 2208   10/06/21 1530  ceFAZolin (ANCEF) IVPB 3g/100 mL premix        3 g 200 mL/hr over 30 Minutes Intravenous  Once 10/06/21 1524 10/06/21 2012     .   Open left knee dislocation complicated by wound infection and wound breakdown  -open left knee dislocation with wound infection   S/p serial debridments   Further management per plastics   Ex fix to remain on for another 2 weeks or so    NWB L leg   - L wrist lunate dislocation s/p ORIF   NWB L wrist  Per dr Grandville Silos   - Pain management:  Multimodal   - DVT/PE prophylaxis:  Patient has not had Lovenox in 2 days presumably due to multiple procedures over the last 2 days.  I have communicated with pharmacy and weight-based Lovenox will be restarted today   - ID:   Cipro and dapto  - Dispo:  Pending plan from [place], PA-C [phone] (C) 11/04/2021, 11:05 AM  [facility] [place] [id number] [phone] Jenetta [phone] (F)    After 5pm and on the weekends please log on to Amion, go to orthopaedics and the look under the [facility] Call for the provider(s) on call. You can also call our office at [phone] and then follow the prompts to be connected to the call team.   Patient ID: Bradley [name], male   DOB: 1981/01/20, 41 y.o.   MRN: [id number]
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
## 2021-11-06 NOTE — Progress Notes (Signed)
Inpatient Rehab Admissions Coordinator:   Met with patient and family at bedside. Patient awaiting another surgery to close flap on L LE. Will continue to follow. Will need to re-submit insurance for prior authorization once patient is nearing discharge.   Rehab Admissons Coordinator  , PT, [facility] [phone]   
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
## 2021-11-07 NOTE — Progress Notes (Signed)
Pt transported to 5N29 with this nurse and tech. Pt's wife at bedside with patient with all pt belongings. Pt specialty walker with pt as well. Pt stable at time of transport.

## 2021-11-07 NOTE — Plan of Care (Signed)
  Problem: Clinical Measurements: Goal: Respiratory complications will improve Outcome: Progressing   Problem: Activity: Goal: Risk for activity intolerance will decrease Outcome: Progressing   Problem: Coping: Goal: Level of anxiety will decrease Outcome: Progressing   Problem: Elimination: Goal: Will not experience complications related to urinary retention Outcome: Progressing   Problem: Pain Managment: Goal: General experience of comfort will improve Outcome: Progressing   Problem: Safety: Goal: Ability to remain free from injury will improve Outcome: Progressing   
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
## 2021-11-08 HISTORY — PX: APPLICATION OF WOUND VAC: SHX5189

## 2021-11-08 NOTE — Anesthesia Postprocedure Evaluation (Signed)
Anesthesia Post Note  Patient: Bradley [name]  Procedure(s) Performed: VAC change in the OR (Left: Knee)     Patient location during evaluation: PACU Anesthesia Type: MAC Level of consciousness: awake Pain management: pain level controlled Vital Signs Assessment: post-procedure vital signs reviewed and stable Respiratory status: spontaneous breathing, nonlabored ventilation, respiratory function stable and patient connected to nasal cannula oxygen Cardiovascular status: stable and blood pressure returned to baseline Postop Assessment: no apparent nausea or vomiting Anesthetic complications: no   No notable events documented.  Last Vitals:  Vitals:   11/08/21 1645 11/08/21 1706  BP: (!) 124/59 137/75  Pulse: 70 70  Resp: 19 18  Temp: 37.1 C 37 C  SpO2: 98% 100%    Last Pain:  Vitals:   11/08/21 1706  TempSrc: Oral  PainSc:                  Chaunda Vandergriff P Yuri Fana

## 2021-11-08 NOTE — Anesthesia Procedure Notes (Signed)
Procedure Name: MAC Date/Time: 11/08/2021 3:30 PM  Performed by: Lorie Phenix, CRNAPre-anesthesia Checklist: Patient identified, Emergency Drugs available, Patient being monitored and Suction available Patient Re-evaluated:Patient Re-evaluated prior to induction Oxygen Delivery Method: Nasal cannula Placement Confirmation: positive ETCO2

## 2021-11-08 NOTE — Progress Notes (Signed)
OT Cancellation Note  Patient Details Name: Bradley [name] MRN: [id number] DOB: [birth year]   Cancelled Treatment:    Reason Eval/Treat Not Completed: Patient at procedure or test/ unavailable (In OR for VAC change)  Ailene Ravel, OTR/L,CBIS  Supplemental OT - [place] [facility]  11/08/2021, 3:23 PM
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
## 2021-11-08 NOTE — Op Note (Signed)
DATE OF OPERATION: 11/08/2021  LOCATION: [facility] In[facility]  PREOPERATIVE DIAGNOSIS: left knee wound  POSTOPERATIVE DIAGNOSIS: Same  PROCEDURE: VAC change to left knee wound 5 x 15 cm  SURGEON: Kyce Ging Sanger Granger Chui, DO  ASSISTANT: Dr. Jeanann Lewandowsky  EBL: none  CONDITION: Stable  COMPLICATIONS: None  INDICATION: The patient, Bradley [name], is a 41 y.o. male born on 06-10-1980, is here for treatment of a left knee wound.   PROCEDURE DETAILS:  The patient was seen prior to surgery and marked.  The IV antibiotics were given. The patient was taken to the operating room and given a sedation. A standard time out was performed and all information was confirmed by those in the room. SCD was placed on the right leg.  The patient did great with the VAC change.  The previous dressing was removed and the new VAC was placed. There was an excellent seal. The patient was allowed to wake up and taken to recovery room in stable condition at the end of the case. The family was notified at the end of the case.   Dr. Lovena Le assisted throughout the case.  Dr. Lovena Le was essential in retraction and counter traction when needed to make the case progress smoothly.  This retraction and assistance made it possible to see the tissue plans for the procedure.  The assistance was needed for blood control, tissue re-approximation and assisted with closure of the incision site.

## 2021-11-08 NOTE — Plan of Care (Signed)

## 2021-11-08 NOTE — Progress Notes (Signed)
PT Cancellation Note  Patient Details Name: Bradley [name] MRN: [id number] DOB: April 20, 1980   Cancelled Treatment:    Reason Eval/Treat Not Completed: Patient at procedure or test/unavailable Pt currently in OR. Will follow up as schedule allows.   Lou Miner, DPT  Acute Rehabilitation Services  Office: [phone]    Rudean Hitt 11/08/2021, 4:19 PM

## 2021-11-08 NOTE — Transfer of Care (Signed)
Immediate Anesthesia Transfer of Care Note  Patient: Bradley [name]  Procedure(s) Performed: VAC change in the OR (Left: Knee)  Patient Location: PACU  Anesthesia Type:MAC  Level of Consciousness: awake and alert   Airway & Oxygen Therapy: Patient Spontanous Breathing  Post-op Assessment: Report given to RN and Post -op Vital signs reviewed and stable  Post vital signs: Reviewed and stable  Last Vitals:  Vitals Value Taken Time  BP 116/75 11/08/21 1615  Temp    Pulse 73 11/08/21 1621  Resp 21 11/08/21 1621  SpO2 96 % 11/08/21 1621  Vitals shown include unvalidated device data.  Last Pain:  Vitals:   11/08/21 1458  TempSrc: Oral  PainSc: 0-No pain      Patients Stated Pain Goal: 3 (36/46/80 3212)  Complications: No notable events documented.
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
## 2021-11-09 NOTE — Progress Notes (Addendum)
Inpatient Rehabilitation Admissions Coordinator   I met with patient and wife at bedside. They state he got up with assistance for his ex fix only and then ambulated to bathroom today. They both feel he is at a level that they can manage at home and not need a CIR admit at this time. They do have DME needs to be arranged for home discharge. We are not pursuing CIR admit at this time. I will alert acute team and TOC. We will sign off. Wife reports they have access to a wheelchair accessible Lucianne Lei from family for MD visits.  Danne Baxter, RN, MSN Rehab Admissions Coordinator [phone] 11/09/2021 12:43 PM
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
## 2021-11-09 NOTE — Progress Notes (Signed)
Subjective: Patient reports pain as moderate. Definitely better than it was. Requiring less narcotics. Chest minimally painful now since adding lidocaine patches. Tolerating diet. Urinating. No SOB. Continues to work with PT on [place] and is making good progress. Very motivated.   Cultures grew Enterococcus faecalis and Achromobacter denitrificans. On IV ABX via PICC line. Has had 2 wound vac changes with plastic surgery team.   Aortogram showed good blood flow to LLE. Xrays have continued to show the same if not similar enough joint alignment for [place] to feel comfortable not adjusting the ex fix.  Objective:   VITALS:   Vitals:   11/08/21 1706 11/08/21 2338 11/09/21 0500 11/09/21 0731  BP: 137/75 135/78  (!) 128/55  Pulse: 70 86  69  Resp: 18   17  Temp: 98.6 F (37 C) 98 F (36.7 C)  (!) 97.5 F (36.4 C)  TempSrc: Oral Oral  Oral  SpO2: 100% 98%  100%  Weight:   (!) 221.3 kg   Height:    6\' 4"  (1.93 m)      Latest Ref Rng & Units 11/09/2021    4:05 AM 11/08/2021    9:38 AM 11/07/2021    5:43 PM  CBC  WBC 4.0 - 10.5 K/uL 5.5  5.6  6.4   Hemoglobin 13.0 - 17.0 g/dL 9.7  9.7  9.4   Hematocrit 39.0 - 52.0 % 32.3  30.8  29.0   Platelets 150 - 400 K/uL 291  282  273       Latest Ref Rng & Units 11/06/2021    4:58 AM 11/03/2021    5:40 PM 10/25/2021    3:03 AM  BMP  Glucose 70 - 99 mg/dL 99  103  115   BUN 6 - 20 mg/dL 14  14  17    Creatinine 0.61 - 1.24 mg/dL 0.94  0.99  1.02   Sodium 135 - 145 mmol/L 137  138  135   Potassium 3.5 - 5.1 mmol/L 3.8  3.6  4.4   Chloride 98 - 111 mmol/L 107  105  104   CO2 22 - 32 mmol/L 24  26  25    Calcium 8.9 - 10.3 mg/dL 8.7  8.7  9.0    Intake/Output      09/27 0701 09/28 0700 09/28 0701 09/29 0700   P.O. 480 240   I.V. (mL/kg) 4413.8 (19.9) 781.6 (3.5)   IV Piggyback 222.3    Total Intake(mL/kg) 5116.1 (23.1) 1021.6 (4.6)   Urine (mL/kg/hr)  450 (0.3)   Stool  0   Total Output  450   Net +5116.1 +571.6        Urine  Occurrence  1 x   Stool Occurrence  1 x      Physical Exam: General: NAD.  Laying in bed, calm, comfortable  Resp: No increased wob Cardio: regular rate and rhythm ABD soft Neurologically intact MSK Neurovascularly intact Sensation intact distally Intact pulses distally Dorsiflexion/Plantar flexion intact  Calf soft and compressible  Incision: dressing C/D/I Ex fix in place Cast to LUE intact, can move fingers Wound vac running with <25 cc dark serosanguinous fluid in canister       Assessment: S/P LEFT KNEE REDUCTION AND PLACEMENT OF EXTERNAL FIXATOR  By Dr. Percell Miller on 10/06/21  S/P LEFT KNEE REDUCTION AND PLACEMENT OF ADDITIONAL PINS IN EXTERNAL FIXATOR  By Dr. Percell Miller on 10/07/21  S/P Procedure(s) (LRB): VAC change in the OR (Left) by Dr. Griffin Basil on  10/21/21  S/P Procedure(s) (LRB): IRRIGATION AND DEBRIDEMENT KNEE (Left) by Dr. Percell Miller on 10/24/21  S/P Procedure(s) (LRB): IRRIGATION AND DEBRIDEMENT KNEE (Left) by Dr. Zachery Dakins on 10/27/21  S/P Procedure: WOUND VAC CHANGE KNEE (Left) By Dr. Marla Roe on 11/02/21  S/P Procedure: WOUND VAC CHANGE KNEE (Left) By Dr. Marla Roe on 11/08/21    Principal Problem:   Left knee dislocation Active Problems:   Wound infection after surgery, left knee    Plan: Plastics team planning to continue to do wound vac changes to close the defect rather than needing a flap closure. Wound vacs have been functioning well thus far. No further I&Ds needed from orthopedic standpoint.   Have scheduled surgery for 11/21/21 to remove the ex-fix. He will then be WBAT in some sort of KI or knee brace to allow the knee to heal what it can on its own and likely scar tissue to form. He will need continued PT to rehab the knee. No plan as of now for surgery later on down the road after ex-fix is removed. 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    Advance diet Up with therapy as able while maintaining WB status Incentive Spirometry Elevate and Apply ice H/H 9.7 today. Low but has been stable. Has Iron supplement  Continue lidocaine patches for sternum pain   Weightbearing: NWB LUE and LLE, able to use platform walker though Insicional and dressing care: Reinforce PRN Orthopedic device(s):  Ex fix for 6 weeks (sx scheduled to remove it on 11/21/21) Showering: Keep dressing dry VTE prophylaxis: Lovenox 100mg  daily while inpatient, can switch to Xarelto x 30 days upon d/c , SCDs, ambulation Pain control: Tylenol, Tramadol, Oxycodone, Morphine, lidocaine patches PRN ABX: per ID via PICC line for at least as long as the ex-fix is in place, once removed then will discuss stopping ABX   Dispo:      Wound closure via plastics. Ex-fix has been in place for almost 5 weeks now so need at least 1 more week before removal. PT/OT continue to recommend CIR however notes from today indicate patient and his wife want to go home and feel they can safely care for him there prior to returning to the hospital for the ex fix removal on 11/21/21.   Plastics team would need patient to make multiple visits to their office for wound vac changes. Hopeful to not need a flap closure. He would have multiple other doctor appointmentd between [place], Dr. [place] Silos, and ID. SW/TOC says HHPT would likely not be approved so he would need OPPT. He would need a HHRN to give his PICC line antibiotics.   I know the patient is tired of being in the hospital and wants to return to his normal environment, but I worry that it is not the best plan for him considering his weight and severity of injuries. He often gets very faint/fatigued from moving even short distances.     After a long discussion, I told the patient that I want him to really push himself hard with PT/OT the next 2-3 days to prepare for returning home. I will evaluate his progress and  readiness on Sunday/Monday and decide if he can discharge home then. He did tell me that if it would be better for him to remain in the hospital until his surgery on 11/21/21 then he  is fine with that option too. He will likely be able to same day discharge home after surgery.    Britt Bottom, PA-C Office [phone] 11/09/2021, 2:53 PM
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
## 2021-11-10 NOTE — Telephone Encounter (Signed)
Pt called back and he has been scheduled.
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
## 2021-11-10 NOTE — Plan of Care (Signed)

## 2021-11-10 NOTE — Telephone Encounter (Signed)
Per msg that [place] sent: Hi, can one of you please call this patient and schedule an appointment with me for in clinic vac change (30 min slot) next Wednesday or Thursday?  Pt was called lmom for pt to call the office to get scheduled for an appointment with [facility].
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
## 2021-11-11 NOTE — Plan of Care (Signed)

## 2021-11-11 NOTE — Plan of Care (Signed)
  Problem: Education: Goal: Knowledge of General Education information will improve Description: Including pain rating scale, medication(s)/side effects and non-pharmacologic comfort measures Outcome: Progressing   Problem: Health Behavior/Discharge Planning: Goal: Ability to manage health-related needs will improve Outcome: Progressing   Problem: Clinical Measurements: Goal: Will remain free from infection Outcome: Progressing   Problem: Clinical Measurements: Goal: Respiratory complications will improve Outcome: Progressing   Problem: Activity: Goal: Risk for activity intolerance will decrease Outcome: Progressing   Problem: Nutrition: Goal: Adequate nutrition will be maintained Outcome: Progressing   Problem: Coping: Goal: Level of anxiety will decrease Outcome: Progressing

## 2021-11-11 NOTE — Plan of Care (Signed)

## 2021-11-11 NOTE — Progress Notes (Signed)
Subjective: 3 Days Post-Op Procedure(s) (LRB): VAC change in the OR (Left) Patient reports pain as mild.  Sitting up in bed very upbeat this AM.  Objective: Vital signs in last 24 hours: Temp:  [97.7 F (36.5 C)-98.7 F (37.1 C)] 98.2 F (36.8 C) (09/30 0444) Pulse Rate:  [75-82] 76 (09/30 0444) Resp:  [17-19] 19 (09/30 0444) BP: (128-141)/(67-82) 128/71 (09/30 0444) SpO2:  [99 %-100 %] 100 % (09/30 0444)  Intake/Output from previous day: 09/29 0701 - 09/30 0700 In: 960 [P.O.:960] Out: 600 [Urine:600] Intake/Output this shift: No intake/output data recorded.  Recent Labs    11/09/21 0405 11/09/21 2015 11/10/21 0418 11/10/21 1746 11/11/21 0429  HGB 9.7* 9.3* 9.2* 9.3* 9.1*   Recent Labs    11/10/21 1746 11/11/21 0429  WBC 6.2 5.8  RBC 3.39* 3.33*  HCT 30.3* 29.5*  PLT 269 259   No results for input(s): "NA", "K", "CL", "CO2", "BUN", "CREATININE", "GLUCOSE", "CALCIUM" in the last 72 hours. No results for input(s): "LABPT", "INR" in the last 72 hours.  Neurovascular intact Sensation intact distally Incision: dressing C/D/I Ex fix in place no drainage at pin sites Left wrist splint in place  Assessment/Plan: 3 Days Post-Op  Principal Problem:   Left knee dislocation Active Problems:   Wound infection after surgery, left knee  NWB LUE, LLE Cont abx per ID Wound vac in place and functioning Pain control as ordered PT/OT   Chriss Czar 11/11/2021, 8:51 AM
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
## 2021-11-12 NOTE — Progress Notes (Cosign Needed Addendum)
    Durable Medical Equipment  (From admission, onward)           Start     Ordered   11/12/21 1551  For home use only DME Bedside commode  Once       Comments: Heavy duty/bariatric  Question:  Patient needs a bedside commode to treat with the following condition  Answer:  Open dislocation of left knee   11/12/21 1551   11/12/21 1549  For home use only DME standard manual wheelchair with seat cushion  Once       Comments: Patient suffers from left knee dislocation which impairs their ability to perform daily activities like bathing, dressing, grooming, and toileting in the home.  A cane or crutch will not resolve issue with performing activities of daily living. A wheelchair will allow patient to safely perform daily activities. Patient can safely propel the wheelchair in the home or has a caregiver who can provide assistance. Length of need 6 months . Accessories: elevating leg rests (ELRs), wheel locks, extensions and anti-tippers.  Heavy duty/bariatric size needed   11/12/21 1551   11/12/21 1547  For home use only DME Walker platform  Once       Comments: Heavy duty/bariatric  Question:  Patient needs a walker to treat with the following condition  Answer:  Left wrist dislocation   11/12/21 1551
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
## 2021-11-13 NOTE — TOC Progression Note (Signed)
Transition of Care [facility]) - Progression Note    Patient Details  Name: Arkin Imran MRN: [id number] Date of Birth: Jul 21, 1980  [facility] CM/SW Contact  [name], RN Phone Number: [phone] 11/13/2021, 1:13 PM  Clinical Narrative:     Received notification from Olivia Mackie at [facility] - authorization for home wound vac approved. If continues to need home wound vac at discharge, will deliver to room on day of discharge.   Expected Discharge Plan: [place] Barriers to Discharge: Continued Medical Work up  Expected Discharge Plan and Services Expected Discharge Plan: [place]   Discharge Planning Services: CM Consult Post Acute Care Choice: Durable Medical Equipment, Home Health                   DME Arranged: 3-N-1, Wheelchair manual, Walker rolling, Negative pressure wound device DME Agency: [employer], [name] Date DME Agency Contacted: 11/12/21 Time DME Agency Contacted: [phone] Representative spoke with at DME Agency: [facility]: IV Antibiotics HH Agency: [facility] Date [place]: 11/12/21 Time [place]: 97 Representative spoke with at [place]: [place] Determinants of Health ([place]) Interventions    Readmission Risk Interventions     No data to display

## 2021-11-13 NOTE — Plan of Care (Signed)
  Problem: Clinical Measurements: Goal: Respiratory complications will improve Outcome: Progressing   Problem: Activity: Goal: Risk for activity intolerance will decrease Outcome: Progressing   Problem: Nutrition: Goal: Adequate nutrition will be maintained Outcome: Progressing   Problem: Coping: Goal: Level of anxiety will decrease Outcome: Progressing   Problem: Elimination: Goal: Will not experience complications related to bowel motility Outcome: Progressing   Problem: Pain Managment: Goal: General experience of comfort will improve Outcome: Progressing   
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
## 2021-11-14 NOTE — Plan of Care (Signed)

## 2021-11-14 NOTE — Plan of Care (Signed)
  Problem: Education: Goal: Knowledge of General Education information will improve Description: Including pain rating scale, medication(s)/side effects and non-pharmacologic comfort measures Outcome: Not Progressing   Problem: Health Behavior/Discharge Planning: Goal: Ability to manage health-related needs will improve Outcome: Not Progressing   Problem: Clinical Measurements: Goal: Ability to maintain clinical measurements within normal limits will improve Outcome: Not Progressing Goal: Will remain free from infection Outcome: Not Progressing Goal: Diagnostic test results will improve Outcome: Not Progressing Goal: Respiratory complications will improve Outcome: Not Progressing Goal: Cardiovascular complication will be avoided Outcome: Not Progressing   Problem: Coping: Goal: Level of anxiety will decrease Outcome: Not Progressing   Problem: Elimination: Goal: Will not experience complications related to bowel motility Outcome: Not Progressing Goal: Will not experience complications related to urinary retention Outcome: Not Progressing   Problem: Pain Managment: Goal: General experience of comfort will improve Outcome: Not Progressing   Problem: Safety: Goal: Ability to remain free from injury will improve Outcome: Not Progressing   Problem: Skin Integrity: Goal: Risk for impaired skin integrity will decrease Outcome: Not Progressing   
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
## 2021-11-15 HISTORY — PX: APPLICATION OF WOUND VAC: SHX5189
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
## 2021-11-15 NOTE — Plan of Care (Signed)
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
## 2021-11-16 NOTE — Plan of Care (Signed)
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
## 2021-11-17 NOTE — Plan of Care (Signed)

## 2021-11-18 NOTE — Progress Notes (Signed)
Subjective: 10 Days Post-Op Procedure(s) (LRB): VAC change in the OR (Left) Patient reports pain as mild.    Objective: Vital signs in last 24 hours: Temp:  [97.8 F (36.6 C)-98.9 F (37.2 C)] 98.3 F (36.8 C) (10/07 0813) Pulse Rate:  [64-74] 64 (10/07 0813) Resp:  [16-18] 18 (10/07 0813) BP: (115-131)/(60-79) 127/79 (10/07 0813) SpO2:  [97 %-99 %] 99 % (10/07 0813)  Intake/Output from previous day: 10/06 0701 - 10/07 0700 In: -  Out: 2600 [Urine:2600] Intake/Output this shift: Total I/O In: -  Out: 1380 [Urine:1380]  No results for input(s): "HGB" in the last 72 hours. No results for input(s): "WBC", "RBC", "HCT", "PLT" in the last 72 hours. No results for input(s): "NA", "K", "CL", "CO2", "BUN", "CREATININE", "GLUCOSE", "CALCIUM" in the last 72 hours. No results for input(s): "LABPT", "INR" in the last 72 hours.  Neurovascular intact Sensation intact distally Intact pulses distally   Assessment/Plan: 10 Days Post-Op Procedure(s) (LRB): VAC change in the OR (Left) Pan for back to OR early this week NWB LLE, LUE   Versie Fleener 11/18/2021, 3:07 PM

## 2021-11-19 NOTE — H&P (View-Only) (Signed)
[facility]thopaedic Trauma Service Progress Note  Patient ID: Bradley [name] MRN: [id number] DOB/AGE: 03-30-1980 41 y.o.  Subjective:  No new issues Ready to get fixator off L leg  ROS As above  Objective:   VITALS:   Vitals:   11/18/21 2138 11/19/21 0348 11/19/21 0808 11/19/21 1145  BP: (!) 142/82 130/70 134/89 (!) 141/93  Pulse: 69 67 72 80  Resp: 18 18 18 18   Temp: 97.7 F (36.5 C) 97.9 F (36.6 C) 97.9 F (36.6 C) 98 F (36.7 C)  TempSrc: Oral Oral Oral Oral  SpO2: 100% 98% 100% 99%  Weight:      Height:        Estimated body mass index is 59.63 kg/m as calculated from the following:   Height as of this encounter: 6\' 4"  (1.93 m).   Weight as of this encounter: 222.2 kg.   Intake/Output      10/07 0701 10/08 0700 10/08 0701 10/09 0700   P.O. 960 120   I.V. (mL/kg) 10 (0)    Total Intake(mL/kg) 970 (4.4) 120 (0.5)   Urine (mL/kg/hr) 2480 (0.5) 1550 (1)   Total Output 2480 1550   Net -1510 -1430          LABS  No results found for this or any previous visit (from the past 24 hour(s)).   PHYSICAL EXAM:   Gen: in bed, NAD, pleasant Left Lower Extremity              Ex fix intact             vac functioning and with good seal              Ext warm              + DP pulse             Good perfusion distally              DPN, SPN, TN sensation symmetric to contra-lateral side             No DCT             Compartments are soft             EHL, FHL, lesser toe motor intact             Ankle flexion, extension, inversion and eversion intact   Assessment/Plan: 11 Days Post-Op   Principal Problem:   Left knee dislocation Active Problems:   Wound infection after surgery, left knee   Anti-infectives (From admission, onward)    Start     Dose/Rate Route Frequency Ordered Stop   11/09/21 0600  ceFAZolin (ANCEF) IVPB 3g/100 mL premix        3 g 200 mL/hr over 30 Minutes  Intravenous On call to O.R. 11/08/21 1454 11/08/21 1535   11/08/21 1455  ceFAZolin (ANCEF) 3-0.9 GM/100ML-% IVPB       Note to Pharmacy: Leonides Sake: cabinet override      11/08/21 1455 11/08/21 1551   10/27/21 1115  ceFAZolin (ANCEF) IVPB 3g/100 mL premix  Status:  Discontinued        3 g 200 mL/hr over 30 Minutes Intravenous On call to O.R. 10/27/21 1017 10/27/21 1039   10/27/21 0725  vancomycin (VANCOCIN) powder  Status:  Discontinued          As needed 10/27/21 0725 10/27/21 0859   10/24/21 2000  DAPTOmycin (CUBICIN) 1,050 mg in sodium chloride 0.9 % IVPB  Status:  Discontinued        8 mg/kg  133.7 kg (Adjusted) 142 mL/hr over 30 Minutes Intravenous Daily 10/21/21 1134 10/21/21 1236   10/24/21 2000  DAPTOmycin (CUBICIN) 1,000 mg in sodium chloride 0.9 % IVPB        1,000 mg 140 mL/hr over 30 Minutes Intravenous Daily 10/21/21 1236     10/24/21 1610  vancomycin (VANCOCIN) powder  Status:  Discontinued          As needed 10/24/21 1620 10/24/21 1704   10/24/21 0600  ceFAZolin (ANCEF) IVPB 3g/100 mL premix        3 g 200 mL/hr over 30 Minutes Intravenous On call to O.R. 10/23/21 1813 10/24/21 1611   10/23/21 1600  DAPTOmycin (CUBICIN) 1,050 mg in sodium chloride 0.9 % IVPB  Status:  Discontinued        8 mg/kg  133.7 kg (Adjusted) 142 mL/hr over 30 Minutes Intravenous Daily 10/21/21 1134 10/21/21 1136   10/23/21 1600  DAPTOmycin (CUBICIN) 1,050 mg in sodium chloride 0.9 % IVPB  Status:  Discontinued        8 mg/kg  133.7 kg (Adjusted) 142 mL/hr over 30 Minutes Intravenous  Once 10/21/21 1136 10/21/21 1236   10/23/21 1600  DAPTOmycin (CUBICIN) 1,000 mg in sodium chloride 0.9 % IVPB        1,000 mg 140 mL/hr over 30 Minutes Intravenous  Once 10/21/21 1236 10/23/21 1759   10/23/21 1015  ciprofloxacin (CIPRO) tablet 750 mg        750 mg Oral 2 times daily 10/23/21 0929     10/22/21 1400  DAPTOmycin (CUBICIN) 1,050 mg in sodium chloride 0.9 % IVPB  Status:  Discontinued        8  mg/kg  133.7 kg (Adjusted) 142 mL/hr over 30 Minutes Intravenous  Once 10/21/21 1134 10/21/21 1236   10/22/21 1400  DAPTOmycin (CUBICIN) 1,000 mg in sodium chloride 0.9 % IVPB        1,000 mg 140 mL/hr over 30 Minutes Intravenous  Once 10/21/21 1236 10/22/21 1608   10/21/21 1900  vancomycin (VANCOREADY) IVPB 1500 mg/300 mL  Status:  Discontinued        1,500 mg 150 mL/hr over 120 Minutes Intravenous Every 8 hours 10/21/21 1026 10/21/21 1125   10/21/21 1400  piperacillin-tazobactam (ZOSYN) IVPB 3.375 g  Status:  Discontinued        3.375 g 12.5 mL/hr over 240 Minutes Intravenous Every 8 hours 10/21/21 1006 10/21/21 1112   10/21/21 1230  DAPTOmycin (CUBICIN) 1,050 mg in sodium chloride 0.9 % IVPB        8 mg/kg  133.7 kg (Adjusted) 142 mL/hr over 30 Minutes Intravenous  Once 10/21/21 1134 10/21/21 1407   10/21/21 1200  cefTRIAXone (ROCEPHIN) 2 g in sodium chloride 0.9 % 100 mL IVPB  Status:  Discontinued        2 g 200 mL/hr over 30 Minutes Intravenous Every 24 hours 10/21/21 1112 10/23/21 0929   10/21/21 1100  vancomycin (VANCOCIN) 2,500 mg in sodium chloride 0.9 % 500 mL IVPB  Status:  Discontinued        2,500 mg 262.5 mL/hr over 120 Minutes Intravenous  Once 10/21/21 1006 10/21/21 1125   10/21/21 0830  tobramycin (NEBCIN) powder  Status:  Discontinued            As needed 10/21/21 0900 10/21/21 0903   10/21/21 0830  vancomycin (VANCOCIN) powder  Status:  Discontinued          As needed 10/21/21 0900 10/21/21 0903   10/21/21 0700  ceFAZolin (ANCEF) 3-0.9 GM/100ML-% IVPB       Note to Pharmacy: Smith, Catherine D: cabinet override      10/21/21 0700 10/21/21 0809   10/21/21 0600  ceFAZolin (ANCEF) IVPB 3g/100 mL premix        3 g 200 mL/hr over 30 Minutes Intravenous On call to O.R. 10/20/21 1653 10/21/21 0803   10/08/21 0823  gentamicin (GARAMYCIN) injection  Status:  Discontinued          As needed 10/08/21 0823 10/08/21 0909   10/08/21 0600  ceFAZolin (ANCEF) IVPB 3g/100 mL premix         3 g 200 mL/hr over 30 Minutes Intravenous To Short Stay 10/08/21 0304 10/08/21 0816   10/06/21 2200  cefTRIAXone (ROCEPHIN) 2 g in sodium chloride 0.9 % 100 mL IVPB        2 g 200 mL/hr over 30 Minutes Intravenous Every 24 hours 10/06/21 2013 10/08/21 2208   10/06/21 1530  ceFAZolin (ANCEF) IVPB 3g/100 mL premix        3 g 200 mL/hr over 30 Minutes Intravenous  Once 10/06/21 1524 10/06/21 2012     .  Open left knee dislocation complicated by wound infection and wound breakdown   -open left knee dislocation with wound infection              S/p serial debridments               OR Tuesday for Ex fix removal                NWB L leg     - L wrist lunate dislocation s/p ORIF              NWB L wrist             Per dr thompson    - Pain management:             Multimodal    - DVT/PE prophylaxis:             weightbased lovenox      - ID:              Cipro and dapto   - Dispo:             OR Tuesday for ex fix removal    Markeith Jue W. Ivie Maese, PA-C [phone] (C) 11/19/2021, 1:56 PM  [facility] [address] [place] [address] [phone] (O) [phone] (F)    After 5pm and on the weekends please log on to Amion, go to orthopaedics and the look under the [facility] Call for the provider(s) on call. You can also call our office at [phone] and then follow the prompts to be connected to the call team.   Patient ID: Kru Korinek, male   DOB: 02/17/1980, 41 y.o.   MRN: [id number]  

## 2021-11-19 NOTE — Progress Notes (Signed)
Orthopaedic Trauma Service Progress Note  Patient ID: Bradley [name] MRN: [id number] DOB/AGE: 03-30-1980 41 y.o.  Subjective:  No new issues Ready to get fixator off L leg  ROS As above  Objective:   VITALS:   Vitals:   11/18/21 2138 11/19/21 0348 11/19/21 0808 11/19/21 1145  BP: (!) 142/82 130/70 134/89 (!) 141/93  Pulse: 69 67 72 80  Resp: 18 18 18 18   Temp: 97.7 F (36.5 C) 97.9 F (36.6 C) 97.9 F (36.6 C) 98 F (36.7 C)  TempSrc: Oral Oral Oral Oral  SpO2: 100% 98% 100% 99%  Weight:      Height:        Estimated body mass index is 59.63 kg/m as calculated from the following:   Height as of this encounter: 6\' 4"  (1.93 m).   Weight as of this encounter: 222.2 kg.   Intake/Output      10/07 0701 10/08 0700 10/08 0701 10/09 0700   P.O. 960 120   I.V. (mL/kg) 10 (0)    Total Intake(mL/kg) 970 (4.4) 120 (0.5)   Urine (mL/kg/hr) 2480 (0.5) 1550 (1)   Total Output 2480 1550   Net -1510 -1430          LABS  No results found for this or any previous visit (from the past 24 hour(s)).   PHYSICAL EXAM:   Gen: in bed, NAD, pleasant Left Lower Extremity              Ex fix intact             vac functioning and with good seal              Ext warm              + DP pulse             Good perfusion distally              DPN, SPN, TN sensation symmetric to contra-lateral side             No DCT             Compartments are soft             EHL, FHL, lesser toe motor intact             Ankle flexion, extension, inversion and eversion intact   Assessment/Plan: 11 Days Post-Op   Principal Problem:   Left knee dislocation Active Problems:   Wound infection after surgery, left knee   Anti-infectives (From admission, onward)    Start     Dose/Rate Route Frequency Ordered Stop   11/09/21 0600  ceFAZolin (ANCEF) IVPB 3g/100 mL premix        3 g 200 mL/hr over 30 Minutes  Intravenous On call to O.R. 11/08/21 1454 11/08/21 1535   11/08/21 1455  ceFAZolin (ANCEF) 3-0.9 GM/100ML-% IVPB       Note to Pharmacy: Leonides Sake: cabinet override      11/08/21 1455 11/08/21 1551   10/27/21 1115  ceFAZolin (ANCEF) IVPB 3g/100 mL premix  Status:  Discontinued        3 g 200 mL/hr over 30 Minutes Intravenous On call to O.R. 10/27/21 1017 10/27/21 1039   10/27/21 0725  vancomycin (VANCOCIN) powder  Status:  Discontinued          As needed 10/27/21 0725 10/27/21 0859   10/24/21 2000  DAPTOmycin (CUBICIN) 1,050 mg in sodium chloride 0.9 % IVPB  Status:  Discontinued        8 mg/kg  133.7 kg (Adjusted) 142 mL/hr over 30 Minutes Intravenous Daily 10/21/21 1134 10/21/21 1236   10/24/21 2000  DAPTOmycin (CUBICIN) 1,000 mg in sodium chloride 0.9 % IVPB        1,000 mg 140 mL/hr over 30 Minutes Intravenous Daily 10/21/21 1236     10/24/21 1610  vancomycin (VANCOCIN) powder  Status:  Discontinued          As needed 10/24/21 1620 10/24/21 1704   10/24/21 0600  ceFAZolin (ANCEF) IVPB 3g/100 mL premix        3 g 200 mL/hr over 30 Minutes Intravenous On call to O.R. 10/23/21 1813 10/24/21 1611   10/23/21 1600  DAPTOmycin (CUBICIN) 1,050 mg in sodium chloride 0.9 % IVPB  Status:  Discontinued        8 mg/kg  133.7 kg (Adjusted) 142 mL/hr over 30 Minutes Intravenous Daily 10/21/21 1134 10/21/21 1136   10/23/21 1600  DAPTOmycin (CUBICIN) 1,050 mg in sodium chloride 0.9 % IVPB  Status:  Discontinued        8 mg/kg  133.7 kg (Adjusted) 142 mL/hr over 30 Minutes Intravenous  Once 10/21/21 1136 10/21/21 1236   10/23/21 1600  DAPTOmycin (CUBICIN) 1,000 mg in sodium chloride 0.9 % IVPB        1,000 mg 140 mL/hr over 30 Minutes Intravenous  Once 10/21/21 1236 10/23/21 1759   10/23/21 1015  ciprofloxacin (CIPRO) tablet 750 mg        750 mg Oral 2 times daily 10/23/21 0929     10/22/21 1400  DAPTOmycin (CUBICIN) 1,050 mg in sodium chloride 0.9 % IVPB  Status:  Discontinued        8  mg/kg  133.7 kg (Adjusted) 142 mL/hr over 30 Minutes Intravenous  Once 10/21/21 1134 10/21/21 1236   10/22/21 1400  DAPTOmycin (CUBICIN) 1,000 mg in sodium chloride 0.9 % IVPB        1,000 mg 140 mL/hr over 30 Minutes Intravenous  Once 10/21/21 1236 10/22/21 1608   10/21/21 1900  vancomycin (VANCOREADY) IVPB 1500 mg/300 mL  Status:  Discontinued        1,500 mg 150 mL/hr over 120 Minutes Intravenous Every 8 hours 10/21/21 1026 10/21/21 1125   10/21/21 1400  piperacillin-tazobactam (ZOSYN) IVPB 3.375 g  Status:  Discontinued        3.375 g 12.5 mL/hr over 240 Minutes Intravenous Every 8 hours 10/21/21 1006 10/21/21 1112   10/21/21 1230  DAPTOmycin (CUBICIN) 1,050 mg in sodium chloride 0.9 % IVPB        8 mg/kg  133.7 kg (Adjusted) 142 mL/hr over 30 Minutes Intravenous  Once 10/21/21 1134 10/21/21 1407   10/21/21 1200  cefTRIAXone (ROCEPHIN) 2 g in sodium chloride 0.9 % 100 mL IVPB  Status:  Discontinued        2 g 200 mL/hr over 30 Minutes Intravenous Every 24 hours 10/21/21 1112 10/23/21 0929   10/21/21 1100  vancomycin (VANCOCIN) 2,500 mg in sodium chloride 0.9 % 500 mL IVPB  Status:  Discontinued        2,500 mg 262.5 mL/hr over 120 Minutes Intravenous  Once 10/21/21 1006 10/21/21 1125   10/21/21 0830  tobramycin (NEBCIN) powder  Status:  Discontinued  As needed 10/21/21 0900 10/21/21 0903   10/21/21 0830  vancomycin (VANCOCIN) powder  Status:  Discontinued          As needed 10/21/21 0900 10/21/21 0903   10/21/21 0700  ceFAZolin (ANCEF) 3-0.9 GM/100ML-% IVPB       Note to Pharmacy: Nyoka Cowden D: cabinet override      10/21/21 0700 10/21/21 0809   10/21/21 0600  ceFAZolin (ANCEF) IVPB 3g/100 mL premix        3 g 200 mL/hr over 30 Minutes Intravenous On call to O.R. 10/20/21 1653 10/21/21 0803   10/08/21 0823  gentamicin (GARAMYCIN) injection  Status:  Discontinued          As needed 10/08/21 0823 10/08/21 0909   10/08/21 0600  ceFAZolin (ANCEF) IVPB 3g/100 mL premix         3 g 200 mL/hr over 30 Minutes Intravenous To Short Stay 10/08/21 0304 10/08/21 0816   10/06/21 2200  cefTRIAXone (ROCEPHIN) 2 g in sodium chloride 0.9 % 100 mL IVPB        2 g 200 mL/hr over 30 Minutes Intravenous Every 24 hours 10/06/21 2013 10/08/21 2208   10/06/21 1530  ceFAZolin (ANCEF) IVPB 3g/100 mL premix        3 g 200 mL/hr over 30 Minutes Intravenous  Once 10/06/21 1524 10/06/21 2012     .  Open left knee dislocation complicated by wound infection and wound breakdown   -open left knee dislocation with wound infection              S/p serial debridments               OR Tuesday for Ex fix removal                NWB L leg     - L wrist lunate dislocation s/p ORIF              NWB L wrist             Per dr Grand[place] Silos    - Pain management:             Multimodal    - DVT/PE prophylaxis:             weightbased lovenox      - ID:              Cipro and dapto   - Dispo:             OR Tuesday for ex fix removal    Jari Pigg, PA-C [phone] (C) 11/19/2021, 1:56 PM  [facility] [place] [address] [phone] Jenetta [phone] (F)    After 5pm and on the weekends please log on to Amion, go to orthopaedics and the look under the [facility] Call for the provider(s) on call. You can also call our office at [phone] and then follow the prompts to be connected to the call team.   Patient ID: Bradley [name], male   DOB: 1980/07/18, 41 y.o.   MRN: [id number]

## 2021-11-19 NOTE — Plan of Care (Signed)
  Problem: Health Behavior/Discharge Planning: Goal: Ability to manage health-related needs will improve Outcome: Progressing   Problem: Activity: Goal: Risk for activity intolerance will decrease Outcome: Progressing   Problem: Nutrition: Goal: Adequate nutrition will be maintained Outcome: Progressing   Problem: Coping: Goal: Level of anxiety will decrease Outcome: Progressing   
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
## 2021-11-21 HISTORY — PX: EXTERNAL FIXATION REMOVAL: SHX5040

## 2021-11-21 NOTE — Progress Notes (Signed)
OT Cancellation Note  Patient Details Name: Bradley [name] MRN: [id number] DOB: Sep 07, 1980   Cancelled Treatment:    Reason Eval/Treat Not Completed: Patient at procedure or test/ unavailable (Surgery. Will return as schedule allows.)  [place], OTR/L Acute Rehab Office: [phone] 11/21/2021, 11:49 AM

## 2021-11-21 NOTE — Anesthesia Procedure Notes (Signed)
Procedure Name: LMA Insertion Date/Time: 11/21/2021 1:11 PM  Performed by: Barrington Ellison, CRNAPre-anesthesia Checklist: Patient identified, Emergency Drugs available, Suction available and Patient being monitored Patient Re-evaluated:Patient Re-evaluated prior to induction Oxygen Delivery Method: Circle System Utilized Preoxygenation: Pre-oxygenation with 100% oxygen Induction Type: IV induction Ventilation: Mask ventilation without difficulty LMA: LMA inserted LMA Size: 5.0 Number of attempts: 1 Placement Confirmation: positive ETCO2 Tube secured with: Tape Dental Injury: Teeth and Oropharynx as per pre-operative assessment

## 2021-11-21 NOTE — Progress Notes (Signed)
Per last note from Dr. Candiss Norse -   Continue IV ABX 6w after hardware removed. Per review of chart today he had ex fix removed with Dr. Percell Miller.   Will continue OPAT and clarify orders:    OPAT ORDERS:   Allergies  Allergen Reactions   Dilaudid [Hydromorphone] Hives     Discharge antibiotics to be given via PICC line:  Per pharmacy protocol  DAPTOMYCIN 1000 mg Q24h   + ORAL Cipro 750 mg BID     Duration: 6 weeks   End Date: 01/04/22  [facility] Care Per Protocol with [employer] Use: Home health RN for IV administration and teaching, line care and labs.    Labs weekly while on IV antibiotics: _x_ CBC with differential __ BMP **TWICE WEEKLY ON VANCOMYCIN  _x_ CMP _x_ CRP _x_ ESR __ Vancomycin trough TWICE WEEKLY __ CK  _x_ Please pull PIC at completion of IV antibiotics __ Please leave PIC in place until doctor has seen patient or been notified  Fax weekly labs to [phone]  Clinic Follow Up Appt: 11/20 @ 9:30 am with Dr. Beatrice Lecher, MSN, NP-C [facility] for Infectious Disease [email or web address] Pager: [phone] Office: [phone] [facility] Main Line: [place] Communication Welcome

## 2021-11-21 NOTE — Interval H&P Note (Signed)
History and Physical Interval Note:  11/21/2021 6:58 AM  Bradley [name]  has presented today for surgery, with the diagnosis of LEFT KNEE DISLOCATION.  The various methods of treatment have been discussed with the patient and family. After consideration of risks, benefits and other options for treatment, the patient has consented to  Procedure(s): REMOVAL EXTERNAL FIXATION LEG (Left) as a surgical intervention.  The patient's history has been reviewed, patient examined, no change in status, stable for surgery.  I have reviewed the patient's chart and labs.  Questions were answered to the patient's satisfaction.     Renette Butters

## 2021-11-21 NOTE — Plan of Care (Signed)

## 2021-11-21 NOTE — TOC Progression Note (Signed)
Transition of Care [facility]) - Progression Note    Patient Details  Name: Nell Schrack MRN: [id number] Date of Birth: 12-22-1980  Transition of [facility]) CM/SW Contact  Sharin Mons, RN Phone Number: 11/21/2021, 3:34 PM  Clinical Narrative:    Hoping to d/c pt to home on tomorrow with IV ABX therapy, and with [employer] wound vac. Per previous CM note Plastics team to manage wound vac drsg changes. Per [name] with [facility] pt with approval for IV ABX therapy, [facility] to provide [facility]  for PICC line  and IV ABX  care. [employer] made aware pt now has copay for DME , RW,W/C and 3 in1. Equipment will be delivered to bedside prior to d/c once copay received.  TOC team following and assisting with TOC needs.....  Expected Discharge Plan: [place] Barriers to Discharge: Continued Medical Work up  Expected Discharge Plan and Services Expected Discharge Plan: [place]   Discharge Planning Services: CM Consult Post Acute Care Choice: Durable Medical Equipment, Home Health                   DME Arranged: 3-N-1, Wheelchair manual, Walker rolling, Negative pressure wound device DME Agency: [facility], [name] Date DME Agency Contacted: 11/12/21 Time DME Agency Contacted: [phone] Representative spoke with at DME Agency: [place]: IV Antibiotics HH Agency: [facility] Date [place]: 11/12/21 Time [place]: 51 Representative spoke with at [place]: [place] Determinants of Health ([place]) Interventions    Readmission Risk Interventions     No data to display

## 2021-11-21 NOTE — Plan of Care (Signed)
  Problem: Health Behavior/Discharge Planning: Goal: Ability to manage health-related needs will improve Outcome: Progressing   Problem: Clinical Measurements: Goal: Will remain free from infection Outcome: Progressing   Problem: Clinical Measurements: Goal: Diagnostic test results will improve Outcome: Progressing   Problem: Activity: Goal: Risk for activity intolerance will decrease Outcome: Progressing   Problem: Nutrition: Goal: Adequate nutrition will be maintained Outcome: Progressing   Problem: Coping: Goal: Level of anxiety will decrease Outcome: Progressing   Problem: Pain Managment: Goal: General experience of comfort will improve Outcome: Progressing   Problem: Elimination: Goal: Will not experience complications related to bowel motility Outcome: Progressing

## 2021-11-21 NOTE — Progress Notes (Signed)
PHARMACY CONSULT NOTE FOR:  OUTPATIENT  PARENTERAL ANTIBIOTIC THERAPY (OPAT)  Indication: Left knee septic arthritis  Regimen: Daptomycin 1000 mg every 24 hours + Ciprofloxacin 750 mg BID  End date: 01/02/22   IV antibiotic discharge orders are pended. To discharging provider:  please sign these orders via discharge navigator,  Select New Orders & click on the button choice - Manage This Unsigned Work.     Thank you for allowing pharmacy to be a part of this patient's care.  Alycia Rossetti, PharmD, BCPS Infectious Diseases Clinical Pharmacist 11/21/2021 2:14 PM   **Pharmacist phone directory can now be found on [email or web address] ([email or web address]).  Listed under [place].
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
## 2021-11-21 NOTE — Transfer of Care (Addendum)
Immediate Anesthesia Transfer of Care Note  Patient: Bradley [name]  Procedure(s) Performed: REMOVAL EXTERNAL FIXATION LEG (Left)  Patient Location: PACU  Anesthesia Type:General  Level of Consciousness: drowsy and patient cooperative  Airway & Oxygen Therapy: Patient Spontanous Breathing and Patient connected to face mask oxygen  Post-op Assessment: Report given to RN  Post vital signs: Reviewed and stable  Last Vitals:  Vitals Value Taken Time  BP 134/68 11/21/21 1332  Temp    Pulse 108 11/21/21 1333  Resp 28 11/21/21 1333  SpO2 92 % 11/21/21 1333  Vitals shown include unvalidated device data.  Last Pain:  Vitals:   11/21/21 1154  TempSrc:   PainSc: 2       Patients Stated Pain Goal: 2 (51/02/58 5277)  Complications: No notable events documented.

## 2021-11-22 NOTE — Progress Notes (Signed)
Subjective: 41 year old male with left lower extremity wound from open left knee dislocation.   Doing well today, hopeful to go home today.  Discussed wound vac changes in the office.  Objective: Vital signs in last 24 hours: Temp:  [97.6 F (36.4 C)-98.1 F (36.7 C)] 97.6 F (36.4 C) (10/11 0739) Pulse Rate:  [75-102] 76 (10/11 0739) Resp:  [15-24] 18 (10/11 0739) BP: (123-136)/(66-79) 135/75 (10/11 0739) SpO2:  [92 %-100 %] 99 % (10/11 0739) FiO2 (%):  [0 %] 0 % (10/10 1741) Last BM Date : 11/20/21  Intake/Output from previous day: 10/10 0701 - 10/11 0700 In: 570 [P.O.:360; I.V.:210] Out: 600 [Urine:600] Intake/Output this shift: Total I/O In: -  Out: 675 [Urine:675]  General appearance: alert, cooperative, no distress, and wife and RN at bedside Resp: unlabored Extremities: LLE with ex-fix pin holes, adaptic and gauze in place. Incision/Wound: LLE wound with myriad in place, mildly dessicated. No surrounding erythema. No foul odors noted. No active drainage.  Lab Results:     Latest Ref Rng & Units 11/20/2021    5:10 PM 11/14/2021    3:05 AM 11/13/2021    5:59 PM  CBC  WBC 4.0 - 10.5 K/uL 7.4  5.8  5.8   Hemoglobin 13.0 - 17.0 g/dL 10.3  9.2  9.3   Hematocrit 39.0 - 52.0 % 32.8  30.0  30.2   Platelets 150 - 400 K/uL 321  256  267     BMET Recent Labs    11/20/21 0310  NA 137  K 3.6  CL 107  CO2 25  GLUCOSE 116*  BUN 15  CREATININE 0.96  CALCIUM 8.6*   PT/INR No results for input(s): "LABPROT", "INR" in the last 72 hours. ABG No results for input(s): "PHART", "HCO3" in the last 72 hours.  Invalid input(s): "PCO2", "PO2"  Studies/Results: No results found.  Anti-infectives: Anti-infectives (From admission, onward)    Start     Dose/Rate Route Frequency Ordered Stop   11/21/21 2000  ceFAZolin (ANCEF) IVPB 3g/100 mL premix        3 g 200 mL/hr over 30 Minutes Intravenous Every 6 hours 11/21/21 1741 11/22/21 0911   11/21/21 0600  ceFAZolin  (ANCEF) IVPB 3g/100 mL premix        3 g 200 mL/hr over 30 Minutes Intravenous On call to O.R. 11/20/21 1721 11/21/21 1302   11/09/21 0600  ceFAZolin (ANCEF) IVPB 3g/100 mL premix        3 g 200 mL/hr over 30 Minutes Intravenous On call to O.R. 11/08/21 1454 11/08/21 1535   11/08/21 1455  ceFAZolin (ANCEF) 3-0.9 GM/100ML-% IVPB       Note to Pharmacy: Leonides Sake: cabinet override      11/08/21 1455 11/08/21 1551   10/27/21 1115  ceFAZolin (ANCEF) IVPB 3g/100 mL premix  Status:  Discontinued        3 g 200 mL/hr over 30 Minutes Intravenous On call to O.R. 10/27/21 1017 10/27/21 1039   10/27/21 0725  vancomycin (VANCOCIN) powder  Status:  Discontinued          As needed 10/27/21 0725 10/27/21 0859   10/24/21 2000  DAPTOmycin (CUBICIN) 1,050 mg in sodium chloride 0.9 % IVPB  Status:  Discontinued        8 mg/kg  133.7 kg (Adjusted) 142 mL/hr over 30 Minutes Intravenous Daily 10/21/21 1134 10/21/21 1236   10/24/21 2000  DAPTOmycin (CUBICIN) 1,000 mg in sodium chloride 0.9 % IVPB  1,000 mg 140 mL/hr over 30 Minutes Intravenous Daily 10/21/21 1236     10/24/21 1610  vancomycin (VANCOCIN) powder  Status:  Discontinued          As needed 10/24/21 1620 10/24/21 1704   10/24/21 0600  ceFAZolin (ANCEF) IVPB 3g/100 mL premix        3 g 200 mL/hr over 30 Minutes Intravenous On call to O.R. 10/23/21 1813 10/24/21 1611   10/23/21 1600  DAPTOmycin (CUBICIN) 1,050 mg in sodium chloride 0.9 % IVPB  Status:  Discontinued        8 mg/kg  133.7 kg (Adjusted) 142 mL/hr over 30 Minutes Intravenous Daily 10/21/21 1134 10/21/21 1136   10/23/21 1600  DAPTOmycin (CUBICIN) 1,050 mg in sodium chloride 0.9 % IVPB  Status:  Discontinued        8 mg/kg  133.7 kg (Adjusted) 142 mL/hr over 30 Minutes Intravenous  Once 10/21/21 1136 10/21/21 1236   10/23/21 1600  DAPTOmycin (CUBICIN) 1,000 mg in sodium chloride 0.9 % IVPB        1,000 mg 140 mL/hr over 30 Minutes Intravenous  Once 10/21/21 1236 10/23/21  1759   10/23/21 1015  ciprofloxacin (CIPRO) tablet 750 mg        750 mg Oral 2 times daily 10/23/21 0929     10/22/21 1400  DAPTOmycin (CUBICIN) 1,050 mg in sodium chloride 0.9 % IVPB  Status:  Discontinued        8 mg/kg  133.7 kg (Adjusted) 142 mL/hr over 30 Minutes Intravenous  Once 10/21/21 1134 10/21/21 1236   10/22/21 1400  DAPTOmycin (CUBICIN) 1,000 mg in sodium chloride 0.9 % IVPB        1,000 mg 140 mL/hr over 30 Minutes Intravenous  Once 10/21/21 1236 10/22/21 1608   10/21/21 1900  vancomycin (VANCOREADY) IVPB 1500 mg/300 mL  Status:  Discontinued        1,500 mg 150 mL/hr over 120 Minutes Intravenous Every 8 hours 10/21/21 1026 10/21/21 1125   10/21/21 1400  piperacillin-tazobactam (ZOSYN) IVPB 3.375 g  Status:  Discontinued        3.375 g 12.5 mL/hr over 240 Minutes Intravenous Every 8 hours 10/21/21 1006 10/21/21 1112   10/21/21 1230  DAPTOmycin (CUBICIN) 1,050 mg in sodium chloride 0.9 % IVPB        8 mg/kg  133.7 kg (Adjusted) 142 mL/hr over 30 Minutes Intravenous  Once 10/21/21 1134 10/21/21 1407   10/21/21 1200  cefTRIAXone (ROCEPHIN) 2 g in sodium chloride 0.9 % 100 mL IVPB  Status:  Discontinued        2 g 200 mL/hr over 30 Minutes Intravenous Every 24 hours 10/21/21 1112 10/23/21 0929   10/21/21 1100  vancomycin (VANCOCIN) 2,500 mg in sodium chloride 0.9 % 500 mL IVPB  Status:  Discontinued        2,500 mg 262.5 mL/hr over 120 Minutes Intravenous  Once 10/21/21 1006 10/21/21 1125   10/21/21 0830  tobramycin (NEBCIN) powder  Status:  Discontinued          As needed 10/21/21 0900 10/21/21 0903   10/21/21 0830  vancomycin (VANCOCIN) powder  Status:  Discontinued          As needed 10/21/21 0900 10/21/21 0903   10/21/21 0700  ceFAZolin (ANCEF) 3-0.9 GM/100ML-% IVPB       Note to Pharmacy: Nyoka Cowden D: cabinet override      10/21/21 0700 10/21/21 0809   10/21/21 0600  ceFAZolin (ANCEF) IVPB 3g/100 mL  premix        3 g 200 mL/hr over 30 Minutes Intravenous On  call to O.R. 10/20/21 1653 10/21/21 0803   10/08/21 0823  gentamicin (GARAMYCIN) injection  Status:  Discontinued          As needed 10/08/21 0823 10/08/21 0909   10/08/21 0600  ceFAZolin (ANCEF) IVPB 3g/100 mL premix        3 g 200 mL/hr over 30 Minutes Intravenous To Short Stay 10/08/21 0304 10/08/21 0816   10/06/21 2200  cefTRIAXone (ROCEPHIN) 2 g in sodium chloride 0.9 % 100 mL IVPB        2 g 200 mL/hr over 30 Minutes Intravenous Every 24 hours 10/06/21 2013 10/08/21 2208   10/06/21 1530  ceFAZolin (ANCEF) IVPB 3g/100 mL premix        3 g 200 mL/hr over 30 Minutes Intravenous  Once 10/06/21 1524 10/06/21 2012       Assessment/Plan: s/p Procedure(s):  LLE Wound: Wound vac reapplied to left LE wound today.  Pt stable for d/c from plastic surgery standpoint.  Wound vac changes 2x/week at [facility] office.   WB status per ortho. DVT ppx per primary.  Dispo: F/u with our office on Monday 11/27/21 for vac change.   LOS: 23 days    Charlies Constable, PA-C 11/22/2021

## 2021-11-22 NOTE — TOC Progression Note (Addendum)
Transition of Care [facility]) - Progression Note    Patient Details  Name: Bradley [name] MRN: [id number] Date of Birth: 05/25/1980  Transition of Care [facility]) CM/SW Contact  Sharin Mons, RN Phone Number: 11/22/2021, 12:03 PM  Clinical Narrative:     Hoping to d/c to home today.DME has been delivered to pt bedside. [employer] wound vac delivered to pt's bedside by NCM. Proof of delivery signed by wife and forms faxed back to [place] @ [phone]. Per [name] with [facility], home infusion teaching initiated with wife. [place] start of care 11/23/2021, [place]. HHRN to complete teaching with wife with visit on tomorrow. Pt to f/u with plastics team  for wound vac drsg changes. NCM to f/u with ortho provider regarding outpatient PT/OT  referral. Wife to provide transportation ot home in w/c accessible vehicle.  TOC team will continue to monitor and assist with needs...   Expected Discharge Plan: [place] Barriers to Discharge: Continued Medical Work up  Expected Discharge Plan and Services Expected Discharge Plan: [place]   Discharge Planning Services: CM Consult Post Acute Care Choice: Durable Medical Equipment, Home Health                   DME Arranged: 3-N-1, Wheelchair manual, Walker rolling, Negative pressure wound device DME Agency: [employer], [name] Date DME Agency Contacted: 11/12/21 Time DME Agency Contacted: [phone] Representative spoke with at DME Agency: [place]: IV Antibiotics HH Agency: [facility] Date [place]: 11/12/21 Time [place]: 35 Representative spoke with at [place]: [place] Determinants of Health ([place]) Interventions    Readmission Risk Interventions     No data to display

## 2021-11-22 NOTE — Progress Notes (Signed)
Orthopedic Tech Progress Note Patient Details:  Bradley [name] [birth year] [id number]  Called in order to [employer] for a [place] [employer].   Patient ID: Bradley [name], male   DOB: Oct 02, 1980, 41 y.o.   MRN: [id number]  Bradley [name] 11/22/2021, 4:05 PM

## 2021-11-22 NOTE — Progress Notes (Signed)
Subjective: Patient reports pain as severe. Had a wound vac change this morning and is having severe muscle spasms and hip pain since. Robaxin and Morphine not helping. Any leg movement hurts. Urinating. No CP, SOB.  Hasn't been able to mobilize to EOB or OOB with PT yet.  Objective:   VITALS:   Vitals:   11/21/21 1741 11/21/21 1900 11/22/21 0532 11/22/21 0739  BP:  130/79 124/66 135/75  Pulse:  94 75 76  Resp:  20 16 18   Temp:  98 F (36.7 C)  97.6 F (36.4 C)  TempSrc:  Oral  Oral  SpO2: 97% 99% 100% 99%  Weight:      Height:          Latest Ref Rng & Units 11/20/2021    5:10 PM 11/14/2021    3:05 AM 11/13/2021    5:59 PM  CBC  WBC 4.0 - 10.5 K/uL 7.4  5.8  5.8   Hemoglobin 13.0 - 17.0 g/dL 01/13/2022  9.2  9.3   Hematocrit 39.0 - 52.0 % 32.8  30.0  30.2   Platelets 150 - 400 K/uL 321  256  267       Latest Ref Rng & Units 11/20/2021    3:10 AM 11/13/2021    3:32 AM 11/06/2021    4:58 AM  BMP  Glucose 70 - 99 mg/dL 11/08/2021  865  99   BUN 6 - 20 mg/dL 15  14  14    Creatinine 0.61 - 1.24 mg/dL 784   6.96   Sodium 135 - 145 mmol/L 137  140  137   Potassium 3.5 - 5.1 mmol/L 3.6  3.9  3.8   Chloride 98 - 111 mmol/L 107  108  107   CO2 22 - 32 mmol/L 25  25  24    Calcium 8.9 - 10.3 mg/dL 8.6  8.9  8.7    Intake/Output      10/10 0701 10/11 0700 10/11 0701 10/12 0700   P.O. 360    I.V. (mL/kg) 210 (0.9)    Total Intake(mL/kg) 570 (2.6)    Urine (mL/kg/hr) 600 (0.1)    Blood 0    Total Output 600    Net -30            Physical Exam: General: NAD.  Laying supine in bed, looks anxious and slightly uncomfortable, upset Resp: No increased wob Cardio: regular rate and rhythm ABD soft Neurologically intact MSK Neurovascularly intact Sensation intact distally Intact pulses distally Dorsiflexion/Plantar flexion intact Very minimal quad activation Incision: dressing C/D/I, KI in place Wound vac in place with ~150cc of serosanguinous fluid in  canister  Assessment: 1 Day Post-Op  S/P Procedure(s) (LRB): REMOVAL EXTERNAL FIXATION LEG (Left) by Dr. 2.95. 2.84 on 11/21/21  Principal Problem:   Left knee dislocation Active Problems:   Wound infection after surgery, left knee    Plan: Better control pain and spasms to help him mobilize  Advance diet Up with therapy Incentive Spirometry Elevate and Apply ice  Weightbearing: WBAT LLE in KI Insicional and dressing care: Dressings left intact until follow-up and Reinforce dressings as needed Orthopedic device(s): None and Wound Vac:LLE Showering: Keep dressing dry VTE prophylaxis:  Lovenox 100mg  daily  , SCDs, ambulation Pain control: will go home on Oxy, Tylenol, Celebrex Follow - up plan:  7-14 days after d/c Contact information:  Jewel Baize MD, Eulah Pont PA-C  Dispo: Home hopefully by tomorrow if we can get his pain  controlled and OOB. He now has a home wound vac approved, DME received, and PICC line ABX set up. Will f/u with plastics for wound vac changes as outpatient. I gave the patient a paper prescription for OPPT.      Britt Bottom, PA-C Office [phone] 11/22/2021, 9:01 AM

## 2021-11-22 NOTE — Plan of Care (Signed)
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
## 2021-11-23 NOTE — Op Note (Signed)
11/21/2021  1:36 PM  PATIENT:  Bradley [name]    PRE-OPERATIVE DIAGNOSIS:  LEFT KNEE DISLOCATION  POST-OPERATIVE DIAGNOSIS:  Same  PROCEDURE:  REMOVAL EXTERNAL FIXATION LEG  SURGEON:  Renette Butters, MD  ASSISTANT: Aggie Moats, PA-C, he was present and scrubbed throughout the case, critical for completion in a timely fashion, and for retraction, instrumentation, and closure.   ANESTHESIA:   gen  PREOPERATIVE INDICATIONS:  [name] is a  41 y.o. male with a diagnosis of LEFT KNEE DISLOCATION who failed conservative measures and elected for surgical management.    The risks benefits and alternatives were discussed with the patient preoperatively including but not limited to the risks of infection, bleeding, nerve injury, cardiopulmonary complications, the need for revision surgery, among others, and the patient was willing to proceed.  OPERATIVE IMPLANTS: none  OPERATIVE FINDINGS: ex fix removed, Vac remained intact  BLOOD LOSS: min  COMPLICATIONS: none  TOURNIQUET TIME: none  OPERATIVE PROCEDURE:  Patient was identified in the preoperative holding area and site was marked by me He was transported to the operating theater and placed on the table in supine position taking care to pad all bony prominences. After a preincinduction time out anesthesia was induced. The left lower extremity was prepped at the pin sites in normal sterile fashion and a pre-incision timeout was performed. He received ancef for preoperative antibiotics.   I removed the ex fix completely.  I then prepped the pin sites and removed each pin in reverse.   Knee was stiff but stable to exam  POST OPERATIVE PLAN: wbat in brace, mobilize and chemical dvt px.

## 2021-11-23 NOTE — Anesthesia Postprocedure Evaluation (Signed)
Anesthesia Post Note  Patient: Bradley [name]  Procedure(s) Performed: REMOVAL EXTERNAL FIXATION LEG (Left: Leg Lower)     Patient location during evaluation: PACU Anesthesia Type: General Level of consciousness: awake and alert Pain management: pain level controlled Vital Signs Assessment: post-procedure vital signs reviewed and stable Respiratory status: spontaneous breathing, nonlabored ventilation, respiratory function stable and patient connected to nasal cannula oxygen Cardiovascular status: blood pressure returned to baseline and stable Postop Assessment: no apparent nausea or vomiting Anesthetic complications: no   No notable events documented.  Last Vitals:  Vitals:   11/22/21 2037 11/23/21 0400  BP: 134/65 128/78  Pulse: 69 70  Resp:  16  Temp: 36.8 C 36.7 C  SpO2: 99% 99%    Last Pain:  Vitals:   11/23/21 0626  TempSrc:   PainSc: [place]
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
## 2021-11-24 NOTE — Progress Notes (Signed)
Subjective: Patient reports pain as much improved today. Less anxiety and muscle spasms. Urinating. No CP, SOB.  Has been able to move the leg some and take a few steps OOB. Becoming more comfortable with mobilization.   Objective:   VITALS:   Vitals:   11/23/21 0800 11/23/21 2037 11/24/21 0535 11/24/21 0752  BP: 126/72 (!) 140/73 (!) 141/83 128/65  Pulse: 71 82 65 72  Resp: 18 19 18 17   Temp: 98 F (36.7 C) 97.9 F (36.6 C) 97.8 F (36.6 C) 97.9 F (36.6 C)  TempSrc: Oral Oral Oral Oral  SpO2: 100% 98% 100% 100%  Weight:      Height:          Latest Ref Rng & Units 11/20/2021    5:10 PM 11/14/2021    3:05 AM 11/13/2021    5:59 PM  CBC  WBC 4.0 - 10.5 K/uL 7.4  5.8  5.8   Hemoglobin 13.0 - 17.0 g/dL 10.3  9.2  9.3   Hematocrit 39.0 - 52.0 % 32.8  30.0  30.2   Platelets 150 - 400 K/uL 321  256  267       Latest Ref Rng & Units 11/20/2021    3:10 AM 11/13/2021    3:32 AM 11/06/2021    4:58 AM  BMP  Glucose 70 - 99 mg/dL 116  100  99   BUN 6 - 20 mg/dL 15  14  14    Creatinine 0.61 - 1.24 mg/dL 0.96  0.90  0.94   Sodium 135 - 145 mmol/L 137  140  137   Potassium 3.5 - 5.1 mmol/L 3.6  3.9  3.8   Chloride 98 - 111 mmol/L 107  108  107   CO2 22 - 32 mmol/L 25  25  24    Calcium 8.9 - 10.3 mg/dL 8.6  8.9  8.7    Intake/Output      10/12 0701 10/13 0700 10/13 0701 10/14 0700   I.V. (mL/kg) 10 (0)    Total Intake(mL/kg) 10 (0)    Urine (mL/kg/hr) 2000 (0.4) 500 (0.3)   Drains     Total Output 2000 500   Net -1990 -500           Physical Exam: General: NAD.  Laying supine in bed, calm, comfortable Resp: No increased wob Cardio: regular rate and rhythm ABD soft Neurologically intact MSK Neurovascularly intact Sensation intact distally Intact pulses distally Dorsiflexion/Plantar flexion intact Minimal quad activation Incision: dressing C/D/I, KI in place Wound vac in place with ~150cc of serosanguinous fluid in canister SAC LUE  Assessment: 3 Days  Post-Op  S/P Procedure(s) (LRB): REMOVAL EXTERNAL FIXATION LEG (Left) by Dr. Ernesta Amble. Percell Miller on 11/21/21  Principal Problem:   Left knee dislocation Active Problems:   Wound infection after surgery, left knee    Plan:  Advance diet Up with therapy Incentive Spirometry Elevate and Apply ice  Weightbearing: WBAT LLE in Bledsoe brace Insicional and dressing care: Dressings left intact until follow-up and Reinforce dressings as needed Orthopedic device(s): wound vac per plastics Showering: Keep dressing dry VTE prophylaxis:  Lovenox 100mg  daily, can transition to Xarelto 10mg  daily x 30 days upon discharge  , SCDs, ambulation Pain control: will go home on Oxy, Tylenol, Celebrex, Robaxin Follow - up plan:  7-14 days after d/c Contact information:  Edmonia Lynch MD, Aggie Moats PA-C  Dispo: Home hopefully tomorrow. He feels better about his sessions with PT yesterday afternoon and today. I  would like for him to work with them at least one more time to make sure he is ready for what will be required of him once he is home. I think he was overwhelmed after surgery with how little ROM he would have of the LLE. He is starting to better understand this will be a long process of recovery and full ROM is very unlikely.   He has a home wound vac approved, DME received, and PICC line ABX set up. Will f/u with plastics for wound vac changes as outpatient. Will f/u with Dr. Janee Morn as outpatient for LUE. Has a script for OPPT.       Jenne Pane, PA-C Office [phone] 11/24/2021, 1:36 PM
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
## 2021-11-25 NOTE — TOC Transition Note (Addendum)
Transition of Care [facility]) - CM/SW Discharge Note   Patient Details  Name: Bradley [name] MRN: [id number] Date of Birth: 1980-07-04  Transition of Care [facility]) CM/SW Contact:  [name], RN Phone Number: [phone] 11/25/2021, 1:21 PM   Clinical Narrative:     Spoke with patient and spouse at the bedside to discuss post acute transition. Transition home delayed from earlier this week d/t pain management needs. Patient now confirms that his pain is more manageable and he is ready to transition home. Confirmed with [name] at [facility] that IV antibiotics to be delivered to the home tomorrow, and [place] scheduled to see patient on Monday. [name] spoke with spouse on the phone concerning hands on training. Spouse acknowledged feeling comfortable administering medication tomorrow. Patient will need to receive today's dose of IV antibiotic before discharging home. DME has been delivered to bedside by [employer]. [employer] home wound vac at bedside with home supplies - spouse stated that supplies had also been delivered to the home. Advised of f/u visit to plastic surgeon on Monday for dressing change and to take supplies to MD office. Spouse to provide transportation home.   UPDATE: Notified by PT of patient needing 30" slide board to help support his leg. Referral called to [name].   UPDATE: Per conversation with PT - patient/spouse to get slide board from different vendor. [employer] aware.   Final next level of care: Home w Home Health Services Barriers to Discharge: No Barriers Identified   Patient Goals and CMS Choice Patient states their goals for this hospitalization and ongoing recovery are:: return home with wife CMS Medicare.gov Compare Post Acute Care list provided to:: Patient Choice offered to / list presented to : Patient  Discharge Placement                       Discharge Plan and Services   Discharge Planning Services: CM Consult Post Acute Care Choice: Durable  Medical Equipment, Home Health          DME Arranged: 3-N-1, Wheelchair manual, Walker rolling, Negative pressure wound device DME Agency: [employer], [name] Date DME Agency Contacted: 11/12/21 Time DME Agency Contacted: [phone] Representative spoke with at DME Agency: [name]: IV Antibiotics HH Agency: [facility] Date [name]: 11/25/21 Time [name]: 62 Representative spoke with at [name]: [name] Determinants of Health ([place]) Interventions     Readmission Risk Interventions     No data to display

## 2021-11-25 NOTE — Progress Notes (Signed)
Orthopaedic Trauma Progress Note  SUBJECTIVE: Doing well today.  Pain controlled.  No specific issues of note.  No chest pain. No SOB. No nausea/vomiting. Significant other at bedside. Ready for discharge today  OBJECTIVE:  Vitals:   11/24/21 2344 11/25/21 0439  BP: (!) 123/50 111/66  Pulse: 86 70  Resp: 16 15  Temp: 98 F (36.7 C) 98 F (36.7 C)  SpO2: 95% 100%    General: Sitting up in bed, no acute distress Respiratory: No increased work of breathing.  Left lower extremity: Dressing clean, dry, intact. Serosanguinous drainage in wound vac canister. Ankle dorsiflexion plantarflexion intact.  Endorses sensation to light touch over all aspects of the foot.  + DP pulse  IMAGING: Stable post op imaging.   LABS:  No results found for this or any previous visit (from the past 24 hour(s)).   ASSESSMENT: Bradley [name] is a 41 y.o. male, 4 Days Post-Op s/p REMOVAL EXTERNAL FIXATION LEG (Left) by Dr. Ernesta Amble. Murphy on 11/21/21  CV/Blood loss: Hgb stable. Hemodynamically stable  PLAN: Weightbearing: WBAT LLE in Bledsoe brace Insicional and dressing care: Dressings left intact until follow-up and Reinforce dressings as needed Orthopedic device(s): wound vac per plastics Showering: Keep dressing dry VTE prophylaxis:  Lovenox 100mg  daily, can transition to Xarelto 10mg  daily x 30 days upon discharge  , SCDs, ambulation Pain control: will go home on Oxy, Tylenol, Celebrex, Robaxin  Dispo: Home today. Wife will need training on abx infusions prior to discharge.    He has a home wound vac approved, DME received, and PICC line ABX set up. Has a script for OPPT.     Follow - up plan: 2 weeks after d/c with Dr. Percell Miller. Will f/u with plastics for wound vac changes as outpatient. Will f/u with Dr. Grandville Silos as outpatient for LUE. Has a script for OPPT.    Contact information:  Katha Hamming MD, Rushie Nyhan PA-C. After hours and holidays please check [email or web address] for group call information  for [facility]   Gwinda Passe, PA-C [phone] (office) [email or web address]
